# Patient Record
Sex: Male | Born: 1970 | Race: White | Hispanic: No | Marital: Married | Smoking: Current some day smoker
Health system: Southern US, Community
[De-identification: ages and names within clinical notes are randomized; demographics above are authoritative.]

## PROBLEM LIST (undated history)

## (undated) DIAGNOSIS — I10 Essential (primary) hypertension: Secondary | ICD-10-CM

## (undated) DIAGNOSIS — N200 Calculus of kidney: Secondary | ICD-10-CM

## (undated) DIAGNOSIS — K5792 Diverticulitis of intestine, part unspecified, without perforation or abscess without bleeding: Secondary | ICD-10-CM

## (undated) HISTORY — PX: FOOT SURGERY: SHX648

---

## 2000-04-19 ENCOUNTER — Emergency Department (HOSPITAL_COMMUNITY): Admission: EM | Admit: 2000-04-19 | Discharge: 2000-04-19 | Payer: Self-pay | Admitting: Emergency Medicine

## 2000-04-19 ENCOUNTER — Encounter: Payer: Self-pay | Admitting: Emergency Medicine

## 2002-10-12 ENCOUNTER — Emergency Department (HOSPITAL_COMMUNITY): Admission: EM | Admit: 2002-10-12 | Discharge: 2002-10-13 | Payer: Self-pay | Admitting: Emergency Medicine

## 2003-06-10 ENCOUNTER — Emergency Department (HOSPITAL_COMMUNITY): Admission: EM | Admit: 2003-06-10 | Discharge: 2003-06-10 | Payer: Self-pay | Admitting: Emergency Medicine

## 2003-09-17 ENCOUNTER — Emergency Department (HOSPITAL_COMMUNITY): Admission: EM | Admit: 2003-09-17 | Discharge: 2003-09-17 | Payer: Self-pay | Admitting: Family Medicine

## 2003-10-31 ENCOUNTER — Emergency Department (HOSPITAL_COMMUNITY): Admission: EM | Admit: 2003-10-31 | Discharge: 2003-11-01 | Payer: Self-pay | Admitting: Emergency Medicine

## 2004-06-02 ENCOUNTER — Emergency Department (HOSPITAL_COMMUNITY): Admission: EM | Admit: 2004-06-02 | Discharge: 2004-06-02 | Payer: Self-pay | Admitting: Emergency Medicine

## 2007-04-05 ENCOUNTER — Emergency Department (HOSPITAL_COMMUNITY): Admission: EM | Admit: 2007-04-05 | Discharge: 2007-04-05 | Payer: Self-pay | Admitting: Family Medicine

## 2011-02-05 LAB — POCT RAPID STREP A: Streptococcus, Group A Screen (Direct): NEGATIVE

## 2019-02-07 ENCOUNTER — Emergency Department (HOSPITAL_BASED_OUTPATIENT_CLINIC_OR_DEPARTMENT_OTHER): Payer: 59

## 2019-02-07 ENCOUNTER — Encounter (HOSPITAL_BASED_OUTPATIENT_CLINIC_OR_DEPARTMENT_OTHER): Payer: Self-pay | Admitting: Emergency Medicine

## 2019-02-07 ENCOUNTER — Other Ambulatory Visit: Payer: Self-pay

## 2019-02-07 ENCOUNTER — Emergency Department (HOSPITAL_BASED_OUTPATIENT_CLINIC_OR_DEPARTMENT_OTHER)
Admission: EM | Admit: 2019-02-07 | Discharge: 2019-02-07 | Disposition: A | Discharge status: Home / Self Care | Payer: 59 | Attending: Emergency Medicine | Admitting: Emergency Medicine

## 2019-02-07 DIAGNOSIS — Z79899 Other long term (current) drug therapy: Secondary | ICD-10-CM | POA: Diagnosis not present

## 2019-02-07 DIAGNOSIS — K5792 Diverticulitis of intestine, part unspecified, without perforation or abscess without bleeding: Secondary | ICD-10-CM

## 2019-02-07 DIAGNOSIS — F17220 Nicotine dependence, chewing tobacco, uncomplicated: Secondary | ICD-10-CM | POA: Insufficient documentation

## 2019-02-07 DIAGNOSIS — R1032 Left lower quadrant pain: Secondary | ICD-10-CM | POA: Diagnosis present

## 2019-02-07 LAB — CBC
HCT: 56.5 % — ABNORMAL HIGH (ref 39.0–52.0)
Hemoglobin: 18.7 g/dL — ABNORMAL HIGH (ref 13.0–17.0)
MCH: 30.7 pg (ref 26.0–34.0)
MCHC: 33.1 g/dL (ref 30.0–36.0)
MCV: 92.6 fL (ref 80.0–100.0)
Platelets: 209 10*3/uL (ref 150–400)
RBC: 6.1 MIL/uL — ABNORMAL HIGH (ref 4.22–5.81)
RDW: 13.1 % (ref 11.5–15.5)
WBC: 11.6 10*3/uL — ABNORMAL HIGH (ref 4.0–10.5)
nRBC: 0 % (ref 0.0–0.2)

## 2019-02-07 LAB — COMPREHENSIVE METABOLIC PANEL
ALT: 28 U/L (ref 0–44)
AST: 21 U/L (ref 15–41)
Albumin: 5.2 g/dL — ABNORMAL HIGH (ref 3.5–5.0)
Alkaline Phosphatase: 68 U/L (ref 38–126)
Anion gap: 13 (ref 5–15)
BUN: 15 mg/dL (ref 6–20)
CO2: 25 mmol/L (ref 22–32)
Calcium: 9.7 mg/dL (ref 8.9–10.3)
Chloride: 99 mmol/L (ref 98–111)
Creatinine, Ser: 1.26 mg/dL — ABNORMAL HIGH (ref 0.61–1.24)
GFR calc Af Amer: >60 mL/min (ref >60)
GFR calc non Af Amer: >60 mL/min (ref >60)
Glucose, Bld: 104 mg/dL — ABNORMAL HIGH (ref 70–99)
Potassium: 4 mmol/L (ref 3.5–5.1)
Sodium: 137 mmol/L (ref 135–145)
Total Bilirubin: 0.7 mg/dL (ref 0.3–1.2)
Total Protein: 8.7 g/dL — ABNORMAL HIGH (ref 6.5–8.1)

## 2019-02-07 LAB — URINALYSIS, ROUTINE W REFLEX MICROSCOPIC
Bilirubin Urine: NEGATIVE
Glucose, UA: NEGATIVE mg/dL
Hgb urine dipstick: NEGATIVE
Ketones, ur: NEGATIVE mg/dL
Leukocytes,Ua: NEGATIVE
Nitrite: NEGATIVE
Protein, ur: NEGATIVE mg/dL
Specific Gravity, Urine: 1.02 (ref 1.005–1.030)
pH: 6 (ref 5.0–8.0)

## 2019-02-07 LAB — LIPASE, BLOOD: Lipase: 33 U/L (ref 11–51)

## 2019-02-07 MED ORDER — ONDANSETRON HCL 4 MG/2ML IJ SOLN
4.0000 mg | Freq: Once | INTRAMUSCULAR | Status: AC
  Administered 2019-02-07: 4 mg via INTRAVENOUS
  Filled 2019-02-07: qty 2

## 2019-02-07 MED ORDER — CIPROFLOXACIN HCL 500 MG PO TABS
500.0000 mg | ORAL_TABLET | Freq: Once | ORAL | Status: AC
  Administered 2019-02-07: 22:00:00 500 mg via ORAL
  Filled 2019-02-07: qty 1

## 2019-02-07 MED ORDER — ONDANSETRON HCL 4 MG PO TABS: 4.0000 mg | ORAL_TABLET | Freq: Three times a day (TID) | ORAL | 0 refills | Status: DC | PRN

## 2019-02-07 MED ORDER — CIPROFLOXACIN HCL 500 MG PO TABS: 500.0000 mg | ORAL_TABLET | Freq: Two times a day (BID) | ORAL | 0 refills | Status: AC

## 2019-02-07 MED ORDER — METRONIDAZOLE 500 MG PO TABS
500.0000 mg | ORAL_TABLET | Freq: Once | ORAL | Status: AC
  Administered 2019-02-07: 22:00:00 500 mg via ORAL
  Filled 2019-02-07: qty 1

## 2019-02-07 MED ORDER — FENTANYL CITRATE (PF) 100 MCG/2ML IJ SOLN
50.0000 ug | Freq: Once | INTRAMUSCULAR | Status: AC
  Administered 2019-02-07: 50 ug via INTRAVENOUS
  Filled 2019-02-07: qty 2

## 2019-02-07 MED ORDER — SODIUM CHLORIDE 0.9 % IV BOLUS
1000.0000 mL | Freq: Once | INTRAVENOUS | Status: AC
  Administered 2019-02-07: 20:00:00 1000 mL via INTRAVENOUS

## 2019-02-07 MED ORDER — METRONIDAZOLE 500 MG PO TABS: 500.0000 mg | ORAL_TABLET | Freq: Three times a day (TID) | ORAL | 0 refills | Status: AC

## 2019-02-07 MED ORDER — IOHEXOL 300 MG/ML  SOLN
100.0000 mL | Freq: Once | INTRAMUSCULAR | Status: AC | PRN
  Administered 2019-02-07: 100 mL via INTRAVENOUS

## 2019-02-07 NOTE — ED Triage Notes (Signed)
Patient states that he is having pain to his lower abdominal area. The patient reports that he is having intermittent increasing in intensity

## 2019-02-07 NOTE — ED Provider Notes (Signed)
[facility] EMERGENCY DEPARTMENT Provider Note   CSN: [id number] Arrival date & time: 02/07/19  1538     History   Chief Complaint Chief Complaint  Patient presents with   Abdominal Pain    HPI Alan Harris is a 48 y.o. male.     The history is provided by the patient.  Abdominal Pain Pain location:  LLQ Pain quality: aching   Pain radiates to:  Does not radiate Pain severity:  Mild Onset quality:  Gradual Duration:  2 days Timing:  Intermittent Progression:  Waxing and waning Chronicity:  Recurrent Context comment:  Hx of diverticulitis  Relieved by:  Nothing Worsened by:  Nothing Associated symptoms: nausea   Associated symptoms: no chest pain, no chills, no cough, no diarrhea, no dysuria, no fever, no hematuria, no shortness of breath, no sore throat and no vomiting   Risk factors: has not had multiple surgeries     History reviewed. No pertinent past medical history.  There are no active problems to display for this patient.   History reviewed. No pertinent surgical history.      Home Medications    Prior to Admission medications   Medication Sig Start Date End Date Taking? Authorizing Provider  ciprofloxacin (CIPRO) 500 MG tablet Take 1 tablet (500 mg total) by mouth 2 (two) times daily for 10 days. 02/07/19 02/17/19  Dacota Devall, DO  ezetimibe-simvastatin (VYTORIN) 10-40 MG tablet Take 1 tablet by mouth daily. 01/26/19   [provider]  metroNIDAZOLE (FLAGYL) 500 MG tablet Take 1 tablet (500 mg total) by mouth 3 (three) times daily for 10 days. 02/07/19 02/17/19  Kimiah Hibner, DO  ondansetron (ZOFRAN) 4 MG tablet Take 1 tablet (4 mg total) by mouth every 8 (eight) hours as needed for nausea or vomiting. 02/07/19   Virgina Norfolk, DO    Family History History reviewed. No pertinent family history.  Social History Social History   Tobacco Use   Smoking status: Never Smoker   Smokeless tobacco: Current User   Types: Chew  Substance Use Topics   Alcohol use: Yes    Frequency: Never   Drug use: Never     Allergies   Patient has no known allergies.   Review of Systems Review of Systems  Constitutional: Negative for chills and fever.  HENT: Negative for ear pain and sore throat.   Eyes: Negative for pain and visual disturbance.  Respiratory: Negative for cough and shortness of breath.   Cardiovascular: Negative for chest pain and palpitations.  Gastrointestinal: Positive for abdominal pain and nausea. Negative for diarrhea and vomiting.  Genitourinary: Negative for dysuria and hematuria.  Musculoskeletal: Negative for arthralgias and back pain.  Skin: Negative for color change and rash.  Neurological: Negative for seizures and syncope.  All other systems reviewed and are negative.    Physical Exam Updated Vital Signs BP (!) 153/117    Pulse 72    Temp 99.5 F (37.5 C) (Oral)    Resp 18    Ht 5\' 8"  (1.727 m)    Wt 93 kg    SpO2 99%    BMI 31.17 kg/m   Physical Exam Vitals signs and nursing note reviewed.  Constitutional:      Appearance: He is well-developed.  HENT:     Head: Normocephalic and atraumatic.     Mouth/Throat:     Mouth: Mucous membranes are moist.  Eyes:     Extraocular Movements: Extraocular movements intact.     Conjunctiva/sclera: Conjunctivae  normal.     Pupils: Pupils are equal, round, and reactive to light.  Neck:     Musculoskeletal: Neck supple.  Cardiovascular:     Rate and Rhythm: Normal rate and regular rhythm.     Heart sounds: Normal heart sounds. No murmur.  Pulmonary:     Effort: Pulmonary effort is normal. No respiratory distress.     Breath sounds: Normal breath sounds.  Abdominal:     General: Abdomen is flat.     Palpations: Abdomen is soft.     Tenderness: There is abdominal tenderness in the suprapubic area and left lower quadrant.  Skin:    General: Skin is warm and dry.     Capillary Refill: Capillary refill takes less than 2  seconds.  Neurological:     General: No focal deficit present.     Mental Status: He is alert.  Psychiatric:        Mood and Affect: Mood normal.      ED Treatments / Results  Labs (all labs ordered are listed, but only abnormal results are displayed) Labs Reviewed  COMPREHENSIVE METABOLIC PANEL - Abnormal; Notable for the following components:      Result Value   Glucose, Bld 104 (*)    Creatinine, Ser 1.26 (*)    Total Protein 8.7 (*)    Albumin 5.2 (*)    All other components within normal limits  CBC - Abnormal; Notable for the following components:   WBC 11.6 (*)    RBC 6.10 (*)    Hemoglobin 18.7 (*)    HCT 56.5 (*)    All other components within normal limits  LIPASE, BLOOD  URINALYSIS, ROUTINE W REFLEX MICROSCOPIC    EKG None  Radiology Ct Abdomen Pelvis W Contrast  Result Date: 02/07/2019 CLINICAL DATA:  Lower abdominal pain. EXAM: CT ABDOMEN AND PELVIS WITH CONTRAST TECHNIQUE: Multidetector CT imaging of the abdomen and pelvis was performed using the standard protocol following bolus administration of intravenous contrast. CONTRAST:  143mL OMNIPAQUE IOHEXOL 300 MG/ML  SOLN COMPARISON:  CT abdomen pelvis dated January 20, 2018. FINDINGS: Lower chest: No acute abnormality. Hepatobiliary: Unchanged mild diffuse hepatic steatosis. No focal liver abnormality. The gallbladder is unremarkable. No biliary dilatation. Pancreas: Unremarkable. No pancreatic ductal dilatation or surrounding inflammatory changes. Spleen: Normal in size. Unchanged 2.5 cm cyst with mild peripheral calcification, benign. Adrenals/Urinary Tract: Adrenal glands are unremarkable. Kidneys are normal, without renal calculi, focal lesion, or hydronephrosis. Bladder is unremarkable for the degree of distention. Stomach/Bowel: Short segment wall thickening of the mid sigmoid colon adjacent to an inflamed diverticulum, with mild adjacent fat stranding, consistent with acute diverticulitis. The stomach and  small bowel are unremarkable. Normal appendix. Vascular/Lymphatic: No significant vascular findings are present. No enlarged abdominal or pelvic lymph nodes. Reproductive: Unchanged mild prostatomegaly. Other: No abdominal wall hernia or abnormality. No abdominopelvic ascites. No pneumoperitoneum. Musculoskeletal: No acute or significant osseous findings. IMPRESSION: 1. Acute sigmoid diverticulitis.  No perforation or abscess. Electronically Signed   By: Titus Dubin M.D.   On: 02/07/2019 21:26    Procedures Procedures (including critical care time)  Medications Ordered in ED Medications  sodium chloride 0.9 % bolus 1,000 mL (0 mLs Intravenous Stopped 02/07/19 2211)  fentaNYL (SUBLIMAZE) injection 50 mcg (50 mcg Intravenous Given 02/07/19 2002)  ondansetron (ZOFRAN) injection 4 mg (4 mg Intravenous Given 02/07/19 2002)  iohexol (OMNIPAQUE) 300 MG/ML solution 100 mL (100 mLs Intravenous Contrast Given 02/07/19 2104)  metroNIDAZOLE (FLAGYL) tablet 500 mg (500  mg Oral Given 02/07/19 2209)  ciprofloxacin (CIPRO) tablet 500 mg (500 mg Oral Given 02/07/19 2209)     Initial Impression / Assessment and Plan / ED Course  I have reviewed the triage vital signs and the nursing notes.  Pertinent labs & imaging results that were available during my care of the patient were reviewed by me and considered in my medical decision making (see chart for details).        Tommas OlpJoseph Sabas is a 48 year old male who presents to the ED with left lower quadrant abdominal pain.  Patient with normal vitals.  No fever.  History of diverticulitis.  Patient with lower abdominal exam for last 2 days.  No diarrhea.  Has had some nausea.  No fever.  Tender in the suprapubic and left lower quadrant on exam.  No urinary symptoms.  Concern for UTI, diverticulitis, bowel obstruction.   Lab work showed mild leukocytosis.  But otherwise electrolytes unremarkable.  Urinalysis negative for infection.  CT scan of the abdomen and  pelvis did show acute diverticulitis but no abscess or perforation.  No bowel obstruction.  No other intra-abdominal process.   Overall patient with diverticulitis and will treat with antibiotics.  Felt better after fluid bolus, Zofran, fentanyl.  Will prescribe antibiotics and Zofran outpatient.  Given return precautions and discharged from ED in good condition.  This chart was dictated using voice recognition software.  Despite best efforts to proofread,  errors can occur which can change the documentation meaning.    Final Clinical Impressions(s) / ED Diagnoses   Final diagnoses:  Acute diverticulitis    ED Discharge Orders         Ordered    ondansetron (ZOFRAN) 4 MG tablet  Every 8 hours PRN     02/07/19 2204    metroNIDAZOLE (FLAGYL) 500 MG tablet  3 times daily     02/07/19 2204    ciprofloxacin (CIPRO) 500 MG tablet  2 times daily     02/07/19 2204           Virgina NorfolkCuratolo, Akshath Mccarey, DO 02/07/19 2214

## 2019-02-07 NOTE — ED Notes (Signed)
Pt reports hx of diverticulitis

## 2019-02-07 NOTE — ED Notes (Signed)
Pt requesting ibuprofen for HA, reports hx of migraine

## 2019-02-07 NOTE — ED Notes (Signed)
Patient transported to CT 

## 2020-06-19 ENCOUNTER — Encounter (HOSPITAL_COMMUNITY): Payer: Self-pay

## 2020-06-19 ENCOUNTER — Other Ambulatory Visit: Payer: Self-pay

## 2020-06-19 ENCOUNTER — Emergency Department (HOSPITAL_COMMUNITY): Payer: No Typology Code available for payment source

## 2020-06-19 ENCOUNTER — Emergency Department (HOSPITAL_COMMUNITY)
Admission: EM | Admit: 2020-06-19 | Discharge: 2020-06-19 | Disposition: A | Discharge status: Home / Self Care | Payer: No Typology Code available for payment source | Attending: Emergency Medicine | Admitting: Emergency Medicine

## 2020-06-19 DIAGNOSIS — M20012 Mallet finger of left finger(s): Secondary | ICD-10-CM | POA: Diagnosis not present

## 2020-06-19 DIAGNOSIS — X58XXXA Exposure to other specified factors, initial encounter: Secondary | ICD-10-CM | POA: Diagnosis not present

## 2020-06-19 DIAGNOSIS — S6992XA Unspecified injury of left wrist, hand and finger(s), initial encounter: Secondary | ICD-10-CM | POA: Diagnosis present

## 2020-06-19 DIAGNOSIS — Y990 Civilian activity done for income or pay: Secondary | ICD-10-CM | POA: Diagnosis not present

## 2020-06-19 MED ORDER — IBUPROFEN 600 MG PO TABS: 600.0000 mg | ORAL_TABLET | Freq: Four times a day (QID) | ORAL | 0 refills | Status: AC | PRN

## 2020-06-19 NOTE — ED Triage Notes (Addendum)
Pt presents with c/o left ring finger injury. 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

## 2020-06-19 NOTE — ED Provider Notes (Signed)
[facility]-EMERGENCY DEPT Provider Note   CSN: [id number] Arrival date & time: 06/19/20  1287     History Chief Complaint  Patient presents with  . Finger Injury    Alan Harris is a 50 y.o. male.  The history is provided by the patient. No language interpreter was used.     50 year old male presenting for evaluation of injury to his left ring finger on his nondominant hand.  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  States pain is minimal but he is unable to fully extend the tip of his finger.  He does not complain of any numbness.  He denies any other injury.  He rates pain as mild throbbing 1 out of 10.  No specific treatment tried.  History reviewed. No pertinent past medical history.  There are no problems to display for this patient.   History reviewed. No pertinent surgical history.     History reviewed. No pertinent family history.  Social History   Tobacco Use  . Smoking status: Never Smoker  . Smokeless tobacco: Current User    Types: Chew  Substance Use Topics  . Alcohol use: Yes  . Drug use: Never    Home Medications Prior to Admission medications   Medication Sig Start Date End Date Taking? Authorizing Provider  ezetimibe-simvastatin (VYTORIN) 10-40 MG tablet Take 1 tablet by mouth daily. 01/26/19   [provider]  ondansetron (ZOFRAN) 4 MG tablet Take 1 tablet (4 mg total) by mouth every 8 (eight) hours as needed for nausea or vomiting. 02/07/19   Virgina Norfolk, DO    Allergies    Patient has no known allergies.  Review of Systems   Review of Systems  Constitutional: Negative for fever.  Musculoskeletal: Positive for arthralgias.  Skin: Negative for wound.  Neurological: Negative for numbness.    Physical Exam Updated Vital Signs BP (!) 154/110 (BP Location: Left Arm)   Pulse 99   Temp 97.6  F (36.4 C) (Oral)   Resp 16   SpO2 96%   Physical Exam Vitals and nursing note reviewed.  Constitutional:      General: He is not in acute distress.    Appearance: He is well-developed and well-nourished.  HENT:     Head: Atraumatic.  Eyes:     Conjunctiva/sclera: Conjunctivae normal.  Musculoskeletal:        General: Deformity (Left ring finger: Mallet finger appearance with flexion at the DIP joint unable to fully extend finger.  Mildly tender to palpation without obvious deformity.  Brisk cap refill sensation intact distally) present.     Cervical back: Neck supple.  Skin:    Findings: No rash.  Neurological:     Mental Status: He is alert.  Psychiatric:        Mood and Affect: Mood and affect normal.     ED Results / Procedures / Treatments   Labs (all labs ordered are listed, but only abnormal results are displayed) Labs Reviewed - No data to display  EKG None  Radiology DG Finger Ring Left  Result Date: 06/19/2020 CLINICAL DATA:  Finger injury with pain. EXAM: LEFT RING FINGER 2+V COMPARISON:  None. FINDINGS: Three views study shows no evidence for an acute fracture. No evidence for subluxation or dislocation. Study somewhat limited in that a true lateral projection is not been obtained. IMPRESSION: No evidence  for acute bony abnormality. Electronically Signed   By: Kennith Center M.D.   On: 06/19/2020 09:59    Procedures Procedures   Medications Ordered in ED Medications - No data to display  ED Course  I have reviewed the triage vital signs and the nursing notes.  Pertinent labs & imaging results that were available during my care of the patient were reviewed by me and considered in my medical decision making (see chart for details).    MDM Rules/Calculators/A&P                          BP (!) 154/110 (BP Location: Left Arm)   Pulse 99   Temp 97.6 F (36.4 C) (Oral)   Resp 16   SpO2 96%   Final Clinical Impression(s) / ED Diagnoses Final  diagnoses:  Mallet deformity of left ring finger    Rx / DC Orders ED Discharge Orders         Ordered    ibuprofen (ADVIL) 600 MG tablet  Every 6 hours PRN        06/19/20 1005         9:38 AM Patient presents with mallet finger deformity of his left ring finger likely suggestive of an extensor tendon injury.  Will place finger in a finger splint, give referral to hand specialist.   Fayrene Helper, PA-C 06/19/20 1006    Tegeler, Canary Brim, MD 06/19/20 [phone]

## 2020-06-19 NOTE — Discharge Instructions (Signed)
You are likely having an injury to the extensor tendon of your finger.  Please wear finger splint and follow up with hand specialist next week for further care.  You may take tylenol or ibuprofen as needed for pain.

## 2020-12-30 IMAGING — CT CT ABD-PELV W/ CM
2 of 5 series · 16 of 46 positions shown, 18 images · IV contrast (APPLIED)
Comparison: CT abdomen pelvis dated January 20, 2018.

CLINICAL DATA: Lower abdominal pain.

EXAM:
CT ABDOMEN AND PELVIS WITH CONTRAST
TECHNIQUE: Multidetector CT imaging of the abdomen and pelvis was performed
using the standard protocol following bolus administration of
intravenous contrast.
CONTRAST:  100mL OMNIPAQUE IOHEXOL 300 MG/ML  SOLN

[Series 2: axial st · axial · 0.77mm/px · z∈[-558,-108]mm · 13 of 102 slices shown, 15 images]
[im 6/102  soft-tissue]
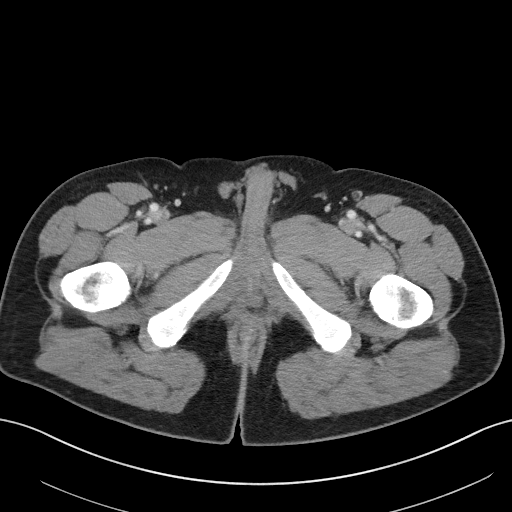
[im 6/102  bone]
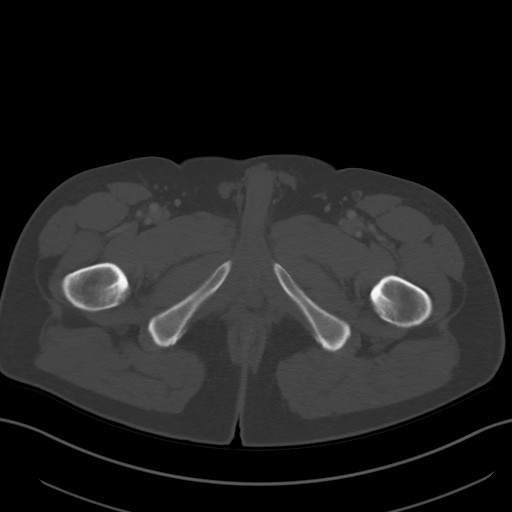
[im 16/102  soft-tissue]
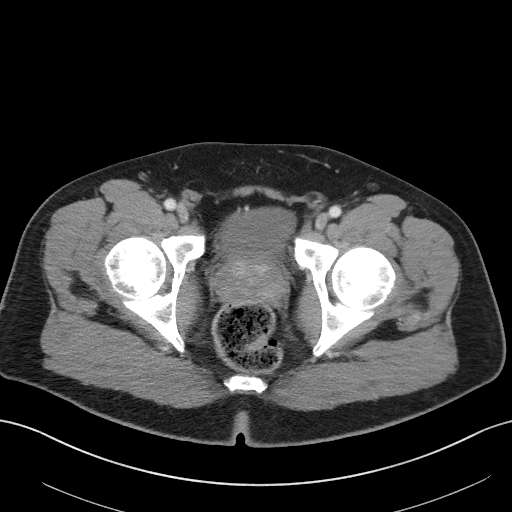
[im 22/102  soft-tissue]
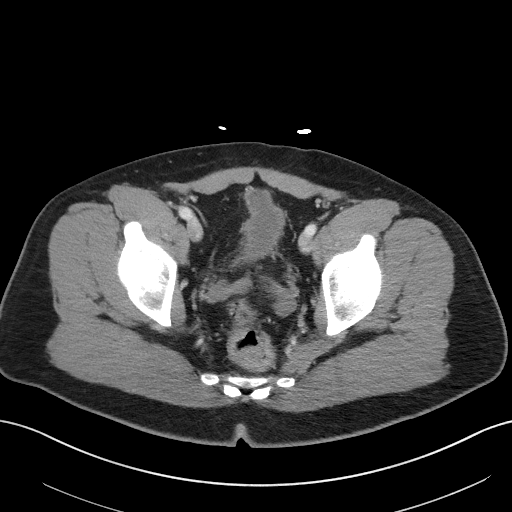
[im 27/102  soft-tissue]
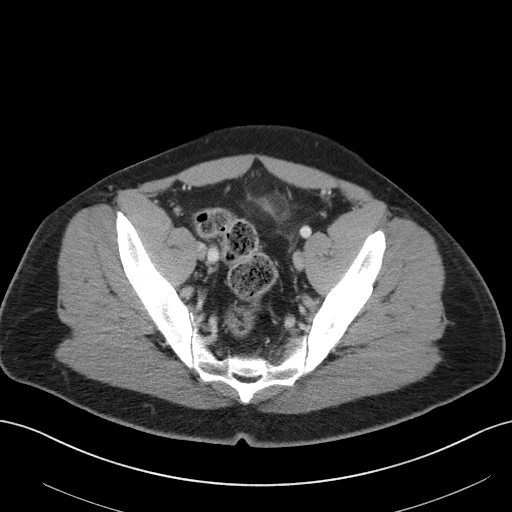
[im 38/102  soft-tissue]
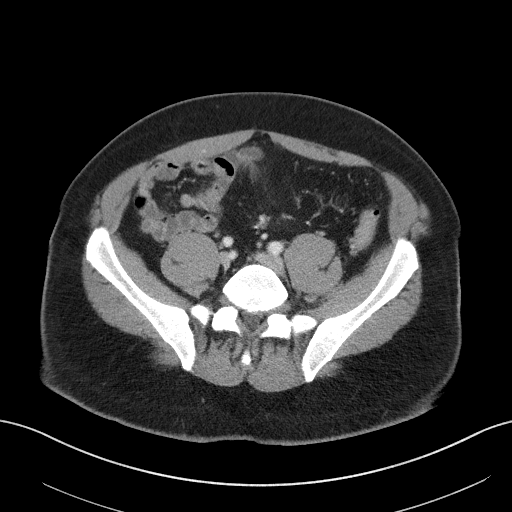
[im 43/102  soft-tissue]
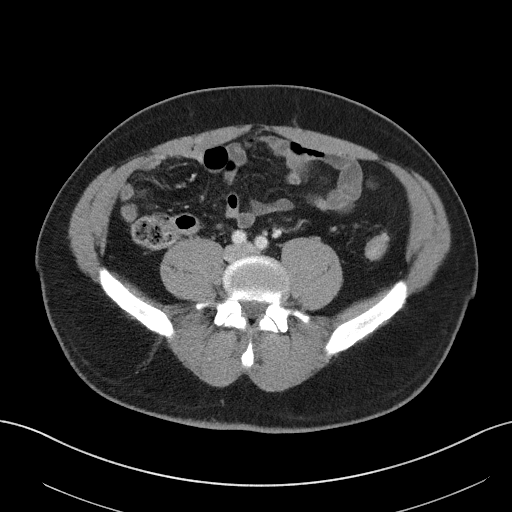
[im 54/102  soft-tissue]
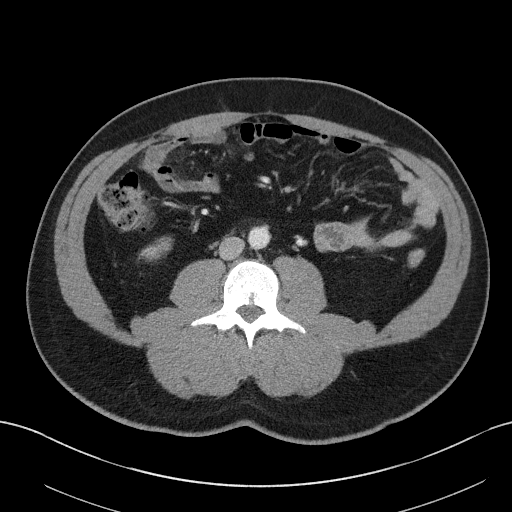
[im 59/102  soft-tissue]
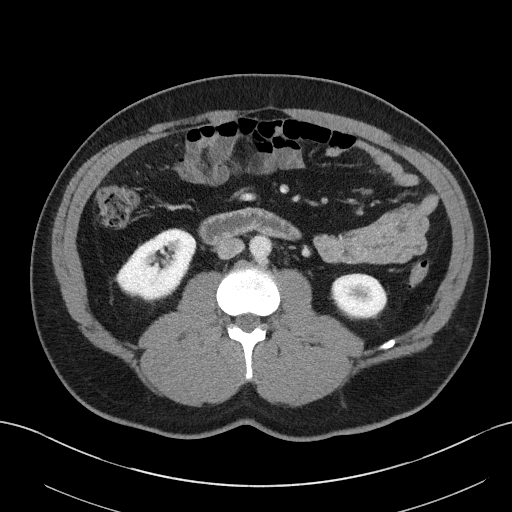
[im 64/102  soft-tissue]
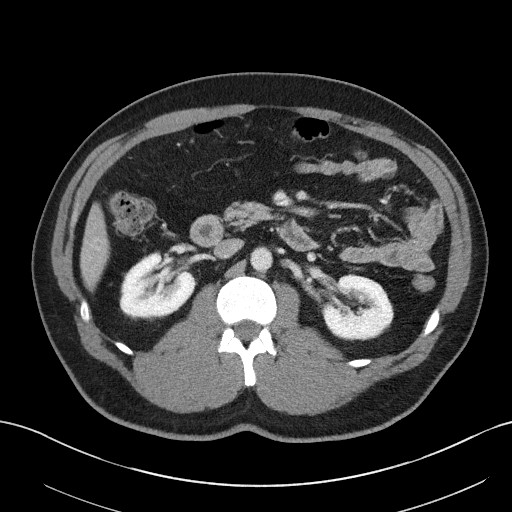
[im 64/102  bone]
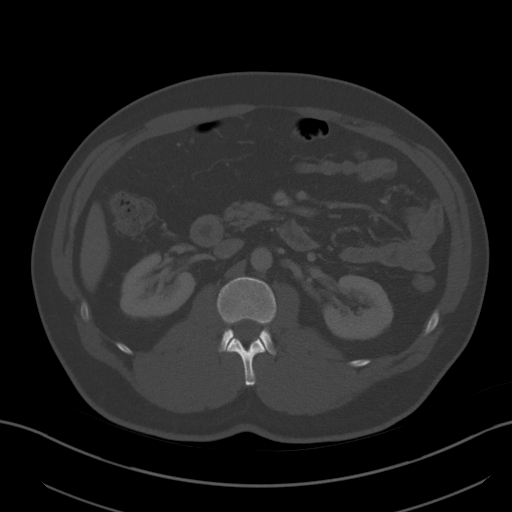
[im 75/102  soft-tissue]
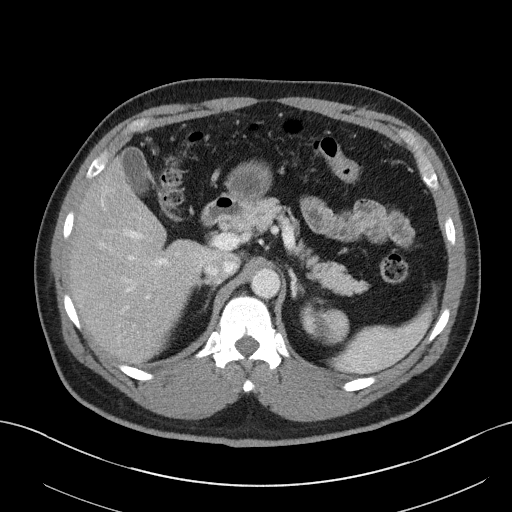
[im 80/102  soft-tissue]
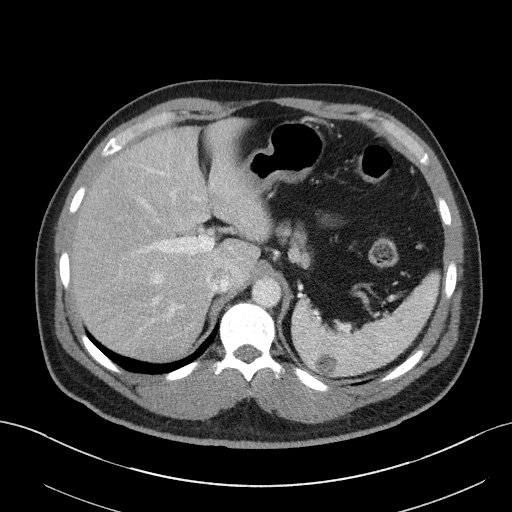
[im 86/102  soft-tissue]
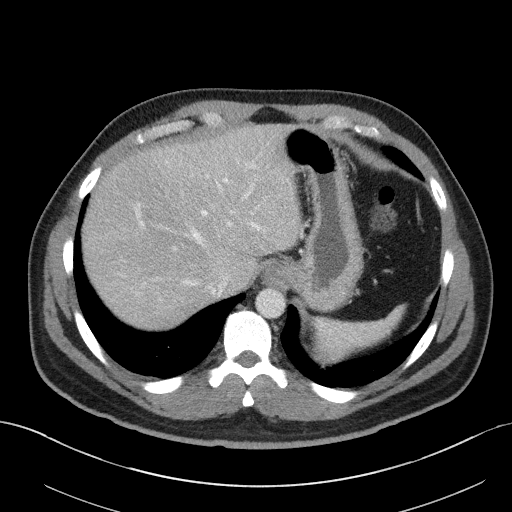
[im 96/102  soft-tissue]
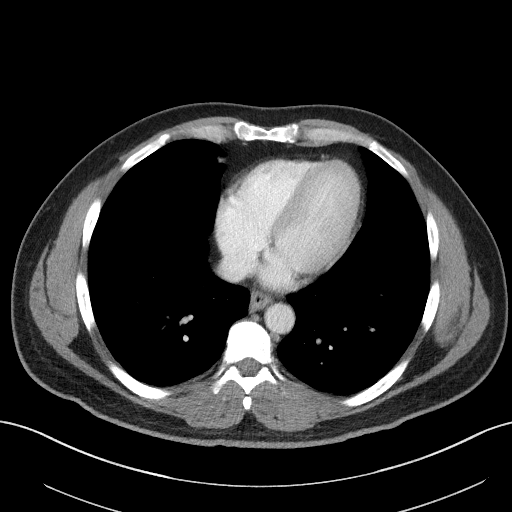

[Series 5: coronal st · coronal · 0.76mm/px · 3 of 94 slices shown]
[im 32/94  soft-tissue]
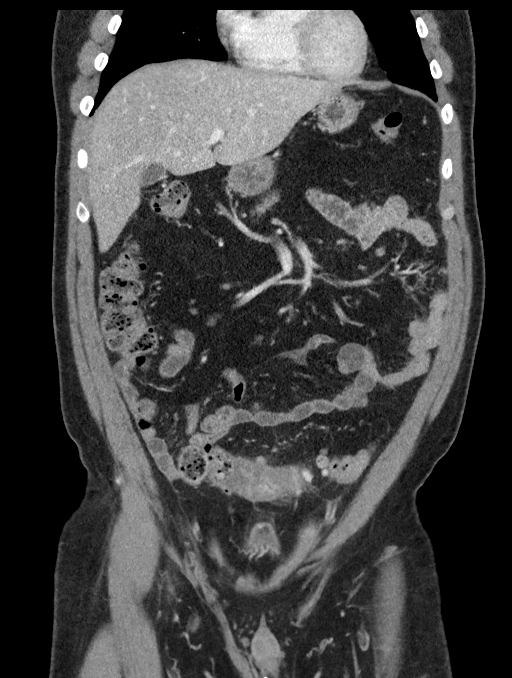
[im 42/94  soft-tissue]
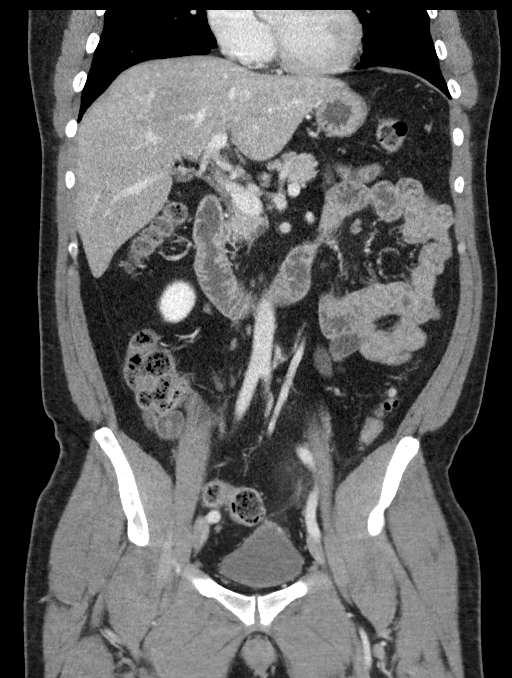
[im 52/94  soft-tissue]
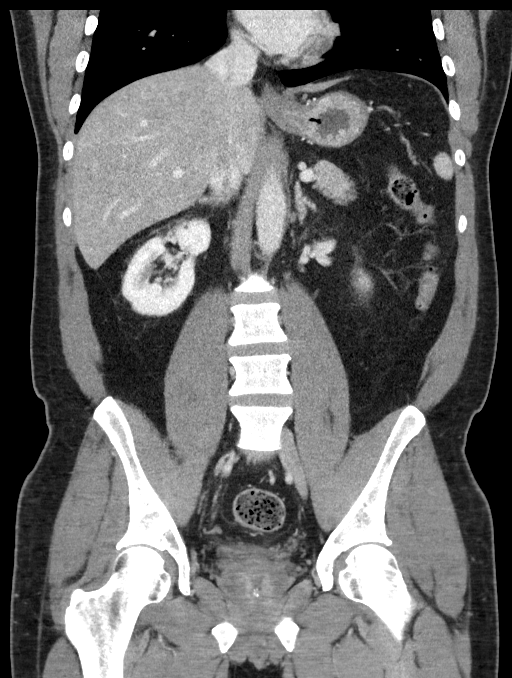

[16 of 46 positions shown; findings below may reference images not displayed]

FINDINGS: Lower chest: No acute abnormality.

Hepatobiliary: Unchanged mild diffuse hepatic steatosis. No focal
liver abnormality. The gallbladder is unremarkable. No biliary
dilatation.

Pancreas: Unremarkable. No pancreatic ductal dilatation or
surrounding inflammatory changes.

Spleen: Normal in size. Unchanged 2.5 cm cyst with mild peripheral
calcification, benign.

Adrenals/Urinary Tract: Adrenal glands are unremarkable. Kidneys are
normal, without renal calculi, focal lesion, or hydronephrosis.
Bladder is unremarkable for the degree of distention.

Stomach/Bowel: Short segment wall thickening of the mid sigmoid
colon adjacent to an inflamed diverticulum, with mild adjacent fat
stranding, consistent with acute diverticulitis. The stomach and
small bowel are unremarkable. Normal appendix.

Vascular/Lymphatic: No significant vascular findings are present. No
enlarged abdominal or pelvic lymph nodes.

Reproductive: Unchanged mild prostatomegaly.

Other: No abdominal wall hernia or abnormality. No abdominopelvic
ascites. No pneumoperitoneum.

Musculoskeletal: No acute or significant osseous findings.
IMPRESSION: 1. Acute sigmoid diverticulitis.  No perforation or abscess.

## 2021-04-19 ENCOUNTER — Emergency Department (HOSPITAL_BASED_OUTPATIENT_CLINIC_OR_DEPARTMENT_OTHER)
Admission: EM | Admit: 2021-04-19 | Discharge: 2021-04-19 | Disposition: A | Discharge status: Home / Self Care | Payer: 59 | Attending: Emergency Medicine | Admitting: Emergency Medicine

## 2021-04-19 ENCOUNTER — Other Ambulatory Visit: Payer: Self-pay

## 2021-04-19 ENCOUNTER — Encounter (HOSPITAL_BASED_OUTPATIENT_CLINIC_OR_DEPARTMENT_OTHER): Payer: Self-pay | Admitting: Emergency Medicine

## 2021-04-19 DIAGNOSIS — J019 Acute sinusitis, unspecified: Secondary | ICD-10-CM | POA: Insufficient documentation

## 2021-04-19 DIAGNOSIS — F17290 Nicotine dependence, other tobacco product, uncomplicated: Secondary | ICD-10-CM | POA: Diagnosis not present

## 2021-04-19 DIAGNOSIS — R0981 Nasal congestion: Secondary | ICD-10-CM | POA: Diagnosis present

## 2021-04-19 DIAGNOSIS — Z20822 Contact with and (suspected) exposure to covid-19: Secondary | ICD-10-CM | POA: Diagnosis not present

## 2021-04-19 LAB — RESP PANEL BY RT-PCR (FLU A&B, COVID) ARPGX2
Influenza A by PCR: NEGATIVE
Influenza B by PCR: NEGATIVE
SARS Coronavirus 2 by RT PCR: NEGATIVE

## 2021-04-19 MED ORDER — NAPROXEN 500 MG PO TABS: 500.0000 mg | ORAL_TABLET | Freq: Two times a day (BID) | ORAL | 0 refills | Status: AC

## 2021-04-19 MED ORDER — DOXYCYCLINE HYCLATE 100 MG PO CAPS: 100.0000 mg | ORAL_CAPSULE | Freq: Two times a day (BID) | ORAL | 0 refills | Status: AC

## 2021-04-19 NOTE — Discharge Instructions (Addendum)
Begin taking doxycycline and naproxen as prescribed.  Drink plenty of fluids and get plenty of rest.  If your COVID test returns positive, be sure to isolate at home for the next 5 days.  Return to the emergency department if symptoms worsen or change.

## 2021-04-19 NOTE — ED Provider Notes (Signed)
[facility] EMERGENCY DEPARTMENT Provider Note   CSN: [id number] Arrival date & time: 04/19/21  0457     History Chief Complaint  Patient presents with   Nasal Congestion    Alan Harris is a 50 y.o. male.  Patient is a 49 year old male with no significant past medical history.  He presents with a nearly 2-week history of facial pressure, nasal congestion, and sinus pain.  He describes low-grade fevers at home.  He denies any ill contacts.  He denies any chest pain or difficulty breathing.  He denies any aggravating or alleviating factors.  The history is provided by the patient.      History reviewed. No pertinent past medical history.  There are no problems to display for this patient.   Past Surgical History:  Procedure Laterality Date   FOOT SURGERY Right        Family History  Problem Relation Age of Onset   Hypertension Mother    Stroke Mother     Social History   Tobacco Use   Smoking status: Some Days    Types: Cigars   Smokeless tobacco: Current    Types: Chew  Substance Use Topics   Alcohol use: Yes   Drug use: Never    Home Medications Prior to Admission medications   Medication Sig Start Date End Date Taking? Authorizing Provider  doxycycline (VIBRAMYCIN) 100 MG capsule Take 1 capsule (100 mg total) by mouth 2 (two) times daily. One po bid x 7 days 04/19/21  Yes Saskia Simerson, Riley Lam, MD  naproxen (NAPROSYN) 500 MG tablet Take 1 tablet (500 mg total) by mouth 2 (two) times daily with a meal. 04/19/21  Yes Kaizlee Carlino, Riley Lam, MD  ezetimibe-simvastatin (VYTORIN) 10-40 MG tablet Take 1 tablet by mouth daily. 01/26/19   [provider]  ibuprofen (ADVIL) 600 MG tablet Take 1 tablet (600 mg total) by mouth every 6 (six) hours as needed. 06/19/20   Fayrene Helper, PA-C  ondansetron (ZOFRAN) 4 MG tablet Take 1 tablet (4 mg total) by mouth every 8 (eight) hours as needed for nausea or vomiting. 02/07/19   Virgina Norfolk, DO    Allergies     Patient has no known allergies.  Review of Systems   Review of Systems  All other systems reviewed and are negative.  Physical Exam Updated Vital Signs BP (!) 150/100 (BP Location: Left Arm)    Pulse 92    Temp 98 F (36.7 C) (Oral)    Resp 14    Ht 5\' 8"  (1.727 m)    Wt 94.8 kg    SpO2 96%    BMI 31.78 kg/m   Physical Exam Vitals and nursing note reviewed.  Constitutional:      General: He is not in acute distress.    Appearance: He is well-developed. He is not diaphoretic.  HENT:     Head: Normocephalic and atraumatic.     Comments: There is frontal and maxillary sinus tenderness.    Mouth/Throat:     Mouth: Mucous membranes are moist.     Pharynx: No oropharyngeal exudate or posterior oropharyngeal erythema.  Cardiovascular:     Rate and Rhythm: Normal rate and regular rhythm.     Heart sounds: No murmur heard.   No friction rub.  Pulmonary:     Effort: Pulmonary effort is normal. No respiratory distress.     Breath sounds: Normal breath sounds. No wheezing or rales.  Abdominal:     General: Bowel sounds are normal. There  is no distension.     Palpations: Abdomen is soft.     Tenderness: There is no abdominal tenderness.  Musculoskeletal:        General: Normal range of motion.     Cervical back: Normal range of motion and neck supple.  Skin:    General: Skin is warm and dry.  Neurological:     Mental Status: He is alert and oriented to person, place, and time.     Coordination: Coordination normal.    ED Results / Procedures / Treatments   Labs (all labs ordered are listed, but only abnormal results are displayed) Labs Reviewed  RESP PANEL BY RT-PCR (FLU A&B, COVID) [id number]    EKG None  Radiology No results found.  Procedures Procedures   Medications Ordered in ED Medications - No data to display  ED Course  I have reviewed the triage vital signs and the nursing notes.  Pertinent labs & imaging results that were available during my care of the  patient were reviewed by me and considered in my medical decision making (see chart for details).    MDM Rules/Calculators/A&P                             Final Clinical Impression(s) / ED Diagnoses Final diagnoses:  Acute sinusitis, recurrence not specified, unspecified location    Rx / DC Orders ED Discharge Orders          Ordered    doxycycline (VIBRAMYCIN) 100 MG capsule  2 times daily        04/19/21 0527    naproxen (NAPROSYN) 500 MG tablet  2 times daily with meals        04/19/21 0527             Geoffery Lyons, MD 04/19/21 [phone]

## 2021-04-19 NOTE — ED Triage Notes (Signed)
Pt states he thinks he has a sinus infection  Pt states he has pressure in his head and face  pt states he has congestion in his chest causing him to cough  Pt states he thinks he pulled a muscle in his back from coughing  Pt had a sinus infection 2 weeks ago  Pt is currently using sudafed and mucinex along with nyquil without relief

## 2022-05-12 IMAGING — DX DG FINGER RING 2+V*L*
3 series · 3 of 3 positions shown · non-contrast
Comparison: None.

CLINICAL DATA: Finger injury with pain.

EXAM:
LEFT RING FINGER 2+V

[finger ap]
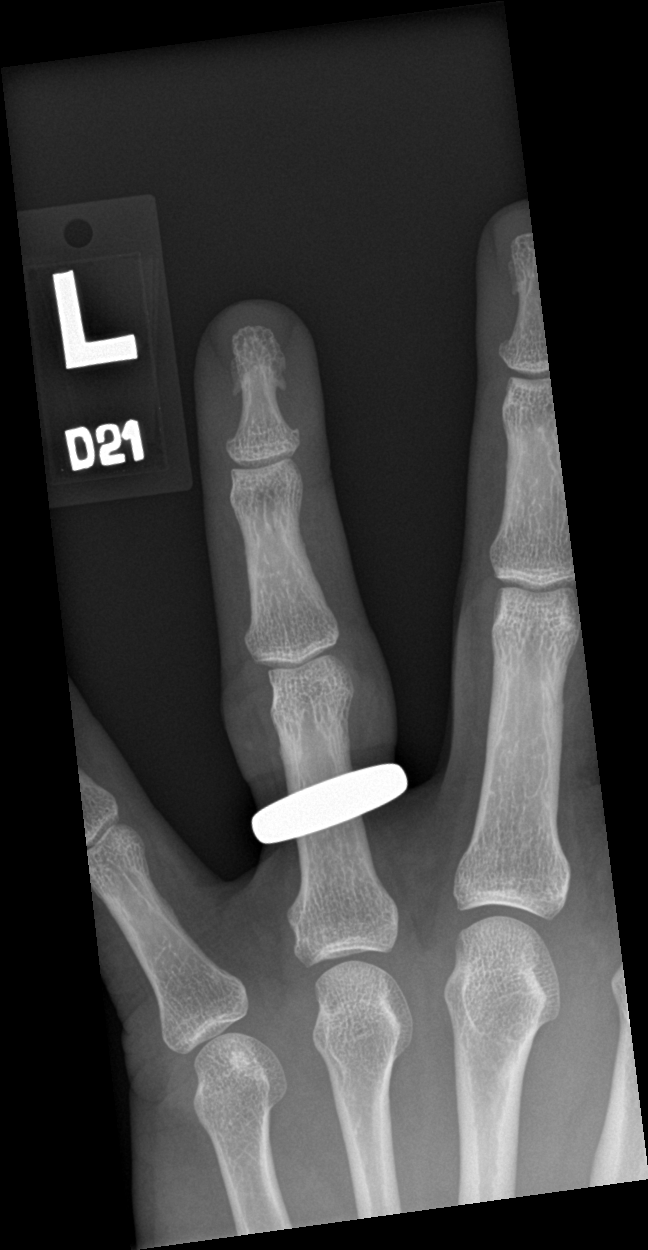

[finger obl]
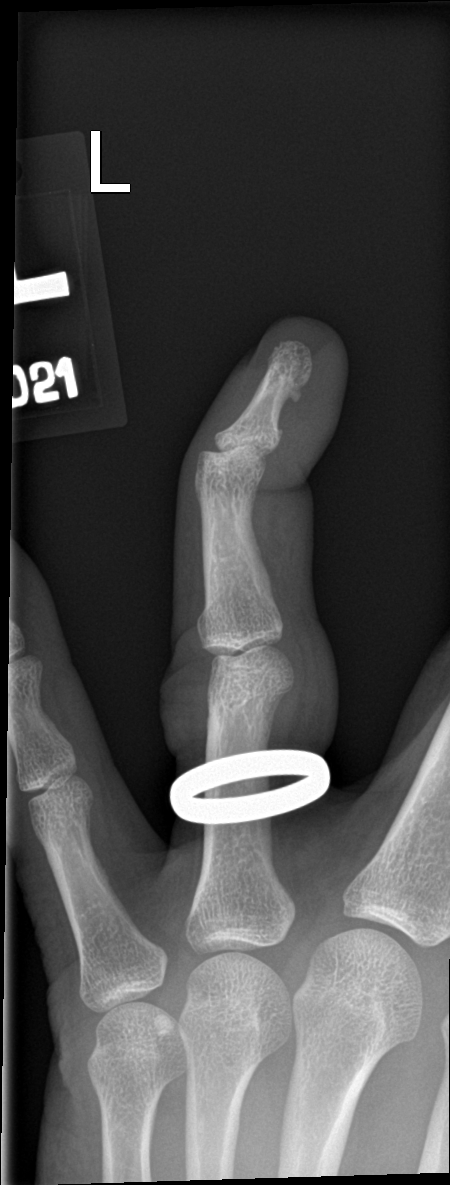

[finger lat]
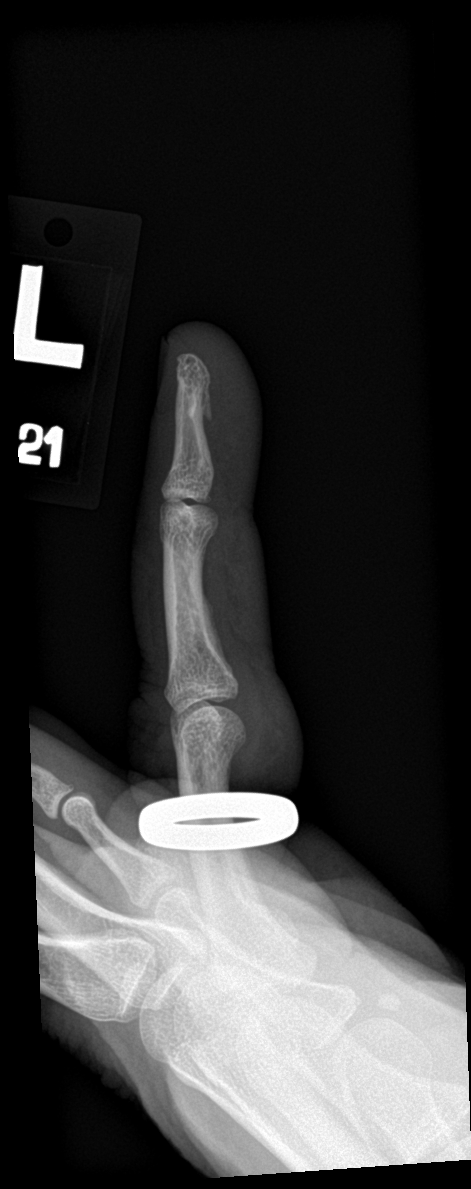

[3 of 3 positions shown; findings below may reference images not displayed]

FINDINGS: Three views study shows no evidence for an acute fracture. No
evidence for subluxation or dislocation. Study somewhat limited in
that a true lateral projection is not been obtained.
IMPRESSION: No evidence for acute bony abnormality.

## 2022-11-30 ENCOUNTER — Other Ambulatory Visit: Payer: Self-pay

## 2022-11-30 ENCOUNTER — Emergency Department (HOSPITAL_BASED_OUTPATIENT_CLINIC_OR_DEPARTMENT_OTHER)
Admission: EM | Admit: 2022-11-30 | Discharge: 2022-11-30 | Disposition: A | Discharge status: Home / Self Care | Payer: 59 | Source: Home / Self Care | Attending: Emergency Medicine | Admitting: Emergency Medicine

## 2022-11-30 ENCOUNTER — Encounter (HOSPITAL_BASED_OUTPATIENT_CLINIC_OR_DEPARTMENT_OTHER): Payer: Self-pay

## 2022-11-30 ENCOUNTER — Emergency Department (HOSPITAL_BASED_OUTPATIENT_CLINIC_OR_DEPARTMENT_OTHER): Payer: 59

## 2022-11-30 DIAGNOSIS — K5792 Diverticulitis of intestine, part unspecified, without perforation or abscess without bleeding: Secondary | ICD-10-CM

## 2022-11-30 DIAGNOSIS — R1032 Left lower quadrant pain: Secondary | ICD-10-CM | POA: Diagnosis present

## 2022-11-30 DIAGNOSIS — K5732 Diverticulitis of large intestine without perforation or abscess without bleeding: Secondary | ICD-10-CM | POA: Insufficient documentation

## 2022-11-30 HISTORY — DX: Calculus of kidney: N20.0

## 2022-11-30 HISTORY — DX: Diverticulitis of intestine, part unspecified, without perforation or abscess without bleeding: K57.92

## 2022-11-30 HISTORY — DX: Essential (primary) hypertension: I10

## 2022-11-30 LAB — CBC
HCT: 48.9 % (ref 39.0–52.0)
Hemoglobin: 16.7 g/dL (ref 13.0–17.0)
MCH: 31.4 pg (ref 26.0–34.0)
MCHC: 34.2 g/dL (ref 30.0–36.0)
MCV: 91.9 fL (ref 80.0–100.0)
Platelets: 209 10*3/uL (ref 150–400)
RBC: 5.32 MIL/uL (ref 4.22–5.81)
RDW: 13.2 % (ref 11.5–15.5)
WBC: 8.7 10*3/uL (ref 4.0–10.5)
nRBC: 0 % (ref 0.0–0.2)

## 2022-11-30 LAB — LIPASE, BLOOD: Lipase: 34 U/L (ref 11–51)

## 2022-11-30 LAB — COMPREHENSIVE METABOLIC PANEL
ALT: 30 U/L (ref 0–44)
AST: 25 U/L (ref 15–41)
Albumin: 4.4 g/dL (ref 3.5–5.0)
Alkaline Phosphatase: 64 U/L (ref 38–126)
Anion gap: 9 (ref 5–15)
BUN: 18 mg/dL (ref 6–20)
CO2: 24 mmol/L (ref 22–32)
Calcium: 8.9 mg/dL (ref 8.9–10.3)
Chloride: 104 mmol/L (ref 98–111)
Creatinine, Ser: 1.43 mg/dL — ABNORMAL HIGH (ref 0.61–1.24)
GFR, Estimated: 59 mL/min — ABNORMAL LOW (ref >60)
Glucose, Bld: 139 mg/dL — ABNORMAL HIGH (ref 70–99)
Potassium: 3.9 mmol/L (ref 3.5–5.1)
Sodium: 137 mmol/L (ref 135–145)
Total Bilirubin: 0.9 mg/dL (ref 0.3–1.2)
Total Protein: 7.5 g/dL (ref 6.5–8.1)

## 2022-11-30 MED ORDER — ONDANSETRON HCL 4 MG PO TABS: 4.0000 mg | ORAL_TABLET | Freq: Four times a day (QID) | ORAL | 0 refills | Status: AC

## 2022-11-30 MED ORDER — SODIUM CHLORIDE 0.9 % IV BOLUS
1000.0000 mL | Freq: Once | INTRAVENOUS | Status: AC
  Administered 2022-11-30: 1000 mL via INTRAVENOUS

## 2022-11-30 MED ORDER — IOHEXOL 300 MG/ML  SOLN
100.0000 mL | Freq: Once | INTRAMUSCULAR | Status: AC | PRN
  Administered 2022-11-30: 100 mL via INTRAVENOUS

## 2022-11-30 MED ORDER — TRAMADOL HCL 50 MG PO TABS: 50.0000 mg | ORAL_TABLET | Freq: Four times a day (QID) | ORAL | 0 refills | Status: AC | PRN

## 2022-11-30 MED ORDER — MORPHINE SULFATE (PF) 4 MG/ML IV SOLN
4.0000 mg | Freq: Once | INTRAVENOUS | Status: AC
  Administered 2022-11-30: 4 mg via INTRAVENOUS
  Filled 2022-11-30: qty 1

## 2022-11-30 MED ORDER — AMOXICILLIN-POT CLAVULANATE 875-125 MG PO TABS: 1.0000 | ORAL_TABLET | Freq: Two times a day (BID) | ORAL | 0 refills | Status: AC

## 2022-11-30 NOTE — ED Provider Notes (Signed)
[place] [facility] Provider Note   CSN: [id number] Arrival date & time: 11/30/22  7829     History  Chief Complaint  Patient presents with   Abdominal Pain    Alan Harris is a 52 y.o. male.  HPI   52 year old male with past medical history of diverticulitis presents emergency department left lower quadrant abdominal pain.  Patient states over the last day he has had a soreness there but the pain became more acute overnight and today.  Feels like his previous episodes of diverticulitis.  He has done well with antibiotics in the past and has not had any complications secondary to diverticulitis.  He had 1 bowel movement this morning that seemed looser than normal.  Denies any fever but endorses chills.  Mild decrease in appetite.  No genitourinary symptoms.  Home Medications Prior to Admission medications   Medication Sig Start Date End Date Taking? Authorizing Provider  doxycycline (VIBRAMYCIN) 100 MG capsule Take 1 capsule (100 mg total) by mouth 2 (two) times daily. One po bid x 7 days 04/19/21   Geoffery Lyons, MD  ezetimibe-simvastatin (VYTORIN) 10-40 MG tablet Take 1 tablet by mouth daily. 01/26/19   [provider]  ibuprofen (ADVIL) 600 MG tablet Take 1 tablet (600 mg total) by mouth every 6 (six) hours as needed. 06/19/20   Fayrene Helper, PA-C  naproxen (NAPROSYN) 500 MG tablet Take 1 tablet (500 mg total) by mouth 2 (two) times daily with a meal. 04/19/21   Geoffery Lyons, MD  ondansetron (ZOFRAN) 4 MG tablet Take 1 tablet (4 mg total) by mouth every 8 (eight) hours as needed for nausea or vomiting. 02/07/19   Virgina Norfolk, DO      Allergies    Patient has no known allergies.    Review of Systems   Review of Systems  Constitutional:  Positive for appetite change and fatigue. Negative for fever.  Respiratory:  Negative for shortness of breath.   Cardiovascular:  Negative for chest pain.  Gastrointestinal:  Positive for  abdominal pain. Negative for blood in stool, diarrhea, nausea and vomiting.  Skin:  Negative for rash.  Neurological:  Negative for headaches.    Physical Exam Updated Vital Signs BP (!) 152/110 (BP Location: Right Arm)   Pulse 89   Temp 98 F (36.7 C) (Oral)   Resp 18   Ht 5\' 8"  (1.727 m)   Wt 90.7 kg   SpO2 97%   BMI 30.41 kg/m  Physical Exam Vitals and nursing note reviewed.  Constitutional:      General: He is not in acute distress.    Appearance: Normal appearance.  HENT:     Head: Normocephalic.     Mouth/Throat:     Mouth: Mucous membranes are moist.  Cardiovascular:     Rate and Rhythm: Normal rate.  Pulmonary:     Effort: Pulmonary effort is normal. No respiratory distress.  Abdominal:     General: Bowel sounds are normal.     Palpations: Abdomen is soft.     Tenderness: There is abdominal tenderness in the left lower quadrant. There is guarding. There is no rebound.  Skin:    General: Skin is warm.  Neurological:     Mental Status: He is alert and oriented to person, place, and time. Mental status is at baseline.  Psychiatric:        Mood and Affect: Mood normal.     ED Results / Procedures / Treatments  Labs (all labs ordered are listed, but only abnormal results are displayed) Labs Reviewed  COMPREHENSIVE METABOLIC PANEL - Abnormal; Notable for the following components:      Result Value   Glucose, Bld 139 (*)    Creatinine, Ser 1.43 (*)    GFR, Estimated 59 (*)    All other components within normal limits  LIPASE, BLOOD  CBC  URINALYSIS, ROUTINE W REFLEX MICROSCOPIC    EKG None  Radiology CT ABDOMEN PELVIS W CONTRAST  Result Date: 11/30/2022 CLINICAL DATA:  Abdominal pain, acute, nonlocalized LLQ, h/o diverticulitis EXAM: CT ABDOMEN AND PELVIS WITH CONTRAST TECHNIQUE: Multidetector CT imaging of the abdomen and pelvis was performed using the standard protocol following bolus administration of intravenous contrast. RADIATION DOSE REDUCTION:  This exam was performed according to the departmental dose-optimization program which includes automated exposure control, adjustment of the mA and/or kV according to patient size and/or use of iterative reconstruction technique. CONTRAST:  OMNIPAQUE IOHEXOL 300 MG/ML  SOLN COMPARISON:  CT scan abdomen and pelvis from 02/07/2019. FINDINGS: Lower chest: The lung bases are clear. No pleural effusion. The heart is normal in size. No pericardial effusion. Hepatobiliary: The liver is normal in size. Non-cirrhotic configuration. No suspicious mass. These is mild diffuse hepatic steatosis. No intrahepatic or extrahepatic bile duct dilation. No calcified gallstones. Normal gallbladder wall thickness. No pericholecystic inflammatory changes. Pancreas: Unremarkable. No pancreatic ductal dilatation or surrounding inflammatory changes. Spleen: Normal in size. Redemonstration of a 2.4 x 2.8 cm cystic lesion along the posteromedial aspect with several foci of calcifications along the inferior aspect. This is essentially unchanged since the prior study and favored to represent a mildly complex cyst. No other new focal lesion seen. Adrenals/Urinary Tract: Adrenal glands are unremarkable. No suspicious renal mass. No hydronephrosis. No renal or ureteric calculi. Unremarkable urinary bladder. Stomach/Bowel: There is short segment of colon (approximately 4 cm), at the level of junction of descending colon and proximal sigmoid colon, exhibiting mild irregular wall thickening and pericolonic fat stranding on the background of several diverticula, compatible with acute uncomplicated diverticulitis. No walled off abscess or loculated collection. No pneumoperitoneum. No disproportionate dilation of small or large bowel loops to suggest bowel obstruction. The appendix is unremarkable. Vascular/Lymphatic: No ascites or pneumoperitoneum. No abdominal or pelvic lymphadenopathy, by size criteria. No aneurysmal dilation of the major  abdominal arteries. Reproductive: Enlarged prostate. Symmetric seminal vesicles. Other: There are bilateral small fat containing inguinal hernias. The soft tissues and abdominal wall are otherwise unremarkable. Musculoskeletal: No suspicious osseous lesions. IMPRESSION: 1. Acute uncomplicated diverticulitis at the level of the junction of descending colon and proximal sigmoid colon. No walled off abscess or loculated collection. No pneumoperitoneum. 2. Multiple other nonacute observations, as described above. Electronically Signed   By: Jules Schick M.D.   On: 11/30/2022 09:06    Procedures Procedures    Medications Ordered in ED Medications  sodium chloride 0.9 % bolus 1,000 mL (1,000 mLs Intravenous New Bag/Given 11/30/22 0837)  morphine (PF) 4 MG/ML injection 4 mg (4 mg Intravenous Given 11/30/22 0837)  iohexol (OMNIPAQUE) 300 MG/ML solution 100 mL (100 mLs Intravenous Contrast Given 11/30/22 0843)    ED Course/ Medical Decision Making/ A&P                                 Medical Decision Making Amount and/or Complexity of Data Reviewed Labs: ordered. Radiology: ordered.  Risk Prescription drug management.   52 year old  male presents emergency department with left lower quadrant abdominal pain, similar to previous diverticulitis episodes.  He is afebrile, well-appearing on exam.  He has tenderness to palpation in the left lower quadrant with voluntary guarding.  Normal bowel sounds.  Blood work is reassuring, with mild elevation of Cr.  CT scan shows uncomplicated diverticulitis of the descending colon.  Patient has done well with antibiotic therapy in the past. 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   Will plan for oral antibiotics and outpatient follow-up.  Patient at this time appears safe and stable for discharge and close outpatient follow up. Discharge plan and strict return to ED precautions discussed,  patient verbalizes understanding and agreement.        Final Clinical Impression(s) / ED Diagnoses Final diagnoses:  None    Rx / DC Orders ED Discharge Orders     None         Rozelle Logan, DO 11/30/22 0930

## 2022-11-30 NOTE — Discharge Instructions (Signed)
You have been seen and discharged from the emergency department.  You were noticed with uncomplicated diverticulitis.  Take antibiotic as directed.  Take nausea and pain medicine as needed.  Do not mix this medication with alcohol or other sedating medications. Do not drive or do heavy physical activity until you know how this medication affects you.  It may cause drowsiness.  Follow-up with your primary provider for further evaluation and further care. Take home medications as prescribed. If you have any worsening symptoms or further concerns for your health please return to an emergency department for further evaluation.

## 2022-11-30 NOTE — ED Notes (Signed)
Patient transported to CT 

## 2022-11-30 NOTE — ED Triage Notes (Signed)
Pt reports to ED with complaints of abdominal pain. States that pain started yesterday. Hx of diverticulitis.

## 2023-06-07 ENCOUNTER — Other Ambulatory Visit: Payer: Self-pay

## 2023-06-07 ENCOUNTER — Emergency Department (HOSPITAL_BASED_OUTPATIENT_CLINIC_OR_DEPARTMENT_OTHER)
Admission: EM | Admit: 2023-06-07 | Discharge: 2023-06-07 | Disposition: A | Discharge status: Home / Self Care | Payer: 59 | Attending: Emergency Medicine | Admitting: Emergency Medicine

## 2023-06-07 ENCOUNTER — Emergency Department (HOSPITAL_BASED_OUTPATIENT_CLINIC_OR_DEPARTMENT_OTHER): Payer: 59

## 2023-06-07 ENCOUNTER — Encounter (HOSPITAL_BASED_OUTPATIENT_CLINIC_OR_DEPARTMENT_OTHER): Payer: Self-pay | Admitting: Emergency Medicine

## 2023-06-07 DIAGNOSIS — R944 Abnormal results of kidney function studies: Secondary | ICD-10-CM | POA: Diagnosis not present

## 2023-06-07 DIAGNOSIS — R1032 Left lower quadrant pain: Secondary | ICD-10-CM | POA: Diagnosis present

## 2023-06-07 DIAGNOSIS — K388 Other specified diseases of appendix: Secondary | ICD-10-CM | POA: Diagnosis not present

## 2023-06-07 DIAGNOSIS — K6389 Other specified diseases of intestine: Secondary | ICD-10-CM

## 2023-06-07 LAB — BASIC METABOLIC PANEL
Anion gap: 10 (ref 5–15)
BUN: 24 mg/dL — ABNORMAL HIGH (ref 6–20)
CO2: 21 mmol/L — ABNORMAL LOW (ref 22–32)
Calcium: 8.9 mg/dL (ref 8.9–10.3)
Chloride: 103 mmol/L (ref 98–111)
Creatinine, Ser: 1.25 mg/dL — ABNORMAL HIGH (ref 0.61–1.24)
GFR, Estimated: >60 mL/min (ref >60)
Glucose, Bld: 153 mg/dL — ABNORMAL HIGH (ref 70–99)
Potassium: 3.9 mmol/L (ref 3.5–5.1)
Sodium: 134 mmol/L — ABNORMAL LOW (ref 135–145)

## 2023-06-07 LAB — CBC WITH DIFFERENTIAL/PLATELET
Abs Immature Granulocytes: 0.02 10*3/uL (ref 0.00–0.07)
Basophils Absolute: 0 10*3/uL (ref 0.0–0.1)
Basophils Relative: 0 %
Eosinophils Absolute: 0.1 10*3/uL (ref 0.0–0.5)
Eosinophils Relative: 1 %
HCT: 48.7 % (ref 39.0–52.0)
Hemoglobin: 17 g/dL (ref 13.0–17.0)
Immature Granulocytes: 0 %
Lymphocytes Relative: 28 %
Lymphs Abs: 2.2 10*3/uL (ref 0.7–4.0)
MCH: 31.6 pg (ref 26.0–34.0)
MCHC: 34.9 g/dL (ref 30.0–36.0)
MCV: 90.5 fL (ref 80.0–100.0)
Monocytes Absolute: 0.7 10*3/uL (ref 0.1–1.0)
Monocytes Relative: 9 %
Neutro Abs: 4.9 10*3/uL (ref 1.7–7.7)
Neutrophils Relative %: 62 %
Platelets: 234 10*3/uL (ref 150–400)
RBC: 5.38 MIL/uL (ref 4.22–5.81)
RDW: 12.9 % (ref 11.5–15.5)
WBC: 8 10*3/uL (ref 4.0–10.5)
nRBC: 0 % (ref 0.0–0.2)

## 2023-06-07 LAB — URINALYSIS, ROUTINE W REFLEX MICROSCOPIC
Bilirubin Urine: NEGATIVE
Glucose, UA: NEGATIVE mg/dL
Hgb urine dipstick: NEGATIVE
Ketones, ur: NEGATIVE mg/dL
Leukocytes,Ua: NEGATIVE
Nitrite: NEGATIVE
Protein, ur: NEGATIVE mg/dL
Specific Gravity, Urine: 1.015 (ref 1.005–1.030)
pH: 7.5 (ref 5.0–8.0)

## 2023-06-07 MED ORDER — NAPROXEN 500 MG PO TABS: 500.0000 mg | ORAL_TABLET | Freq: Two times a day (BID) | ORAL | 0 refills | Status: AC

## 2023-06-07 MED ORDER — IOHEXOL 300 MG/ML  SOLN
100.0000 mL | Freq: Once | INTRAMUSCULAR | Status: AC | PRN
  Administered 2023-06-07: 100 mL via INTRAVENOUS

## 2023-06-07 MED ORDER — ONDANSETRON HCL 4 MG/2ML IJ SOLN
4.0000 mg | Freq: Once | INTRAMUSCULAR | Status: AC
  Administered 2023-06-07: 4 mg via INTRAVENOUS
  Filled 2023-06-07: qty 2

## 2023-06-07 MED ORDER — KETOROLAC TROMETHAMINE 30 MG/ML IJ SOLN
30.0000 mg | Freq: Once | INTRAMUSCULAR | Status: AC
  Administered 2023-06-07: 30 mg via INTRAVENOUS
  Filled 2023-06-07: qty 1

## 2023-06-07 NOTE — ED Provider Notes (Signed)
 [place] [facility] Provider Note   CSN: [id number] Arrival date & time: 06/07/23  9685     History  Chief Complaint  Patient presents with   Abdominal Pain    Alan Harris is a 53 y.o. male.  The history is provided by the patient.  Abdominal Pain Pain location:  LLQ Pain radiates to:  Does not radiate Pain severity:  Severe Onset quality:  Sudden Duration:  2 days Timing:  Constant Progression:  Unchanged Chronicity:  Recurrent Relieved by:  Nothing Worsened by:  Nothing Associated symptoms: no fever and no vomiting   Risk factors: has not had multiple surgeries   Patient with a history of diverticulitis x 7 in the past couple of years presents with LLQ pain that he feels is a flare of his diverticulitis.      Home Medications Prior to Admission medications   Medication Sig Start Date End Date Taking? Authorizing Provider  naproxen  (NAPROSYN ) 500 MG tablet Take 1 tablet (500 mg total) by mouth 2 (two) times daily with a meal. 06/07/23  Yes Jacolby Risby, MD  amoxicillin -clavulanate (AUGMENTIN ) 875-125 MG tablet Take 1 tablet by mouth every 12 (twelve) hours. 11/30/22   Horton, Roxie HERO, DO  doxycycline  (VIBRAMYCIN ) 100 MG capsule Take 1 capsule (100 mg total) by mouth 2 (two) times daily. One po bid x 7 days 04/19/21   Geroldine Berg, MD  ezetimibe-simvastatin (VYTORIN) 10-40 MG tablet Take 1 tablet by mouth daily. 01/26/19   [provider]  ibuprofen  (ADVIL ) 600 MG tablet Take 1 tablet (600 mg total) by mouth every 6 (six) hours as needed. 06/19/20   Nivia Colon, PA-C  naproxen  (NAPROSYN ) 500 MG tablet Take 1 tablet (500 mg total) by mouth 2 (two) times daily with a meal. 04/19/21   Geroldine Berg, MD  ondansetron  (ZOFRAN ) 4 MG tablet Take 1 tablet (4 mg total) by mouth every 6 (six) hours. 11/30/22   Horton, Roxie HERO, DO  traMADol  (ULTRAM ) 50 MG tablet Take 1 tablet (50 mg total) by mouth every 6 (six) hours as needed.  11/30/22   Horton, Kristie M, DO      Allergies    Patient has no known allergies.    Review of Systems   Review of Systems  Constitutional:  Negative for fever.  HENT:  Negative for facial swelling.   Eyes:  Negative for redness.  Gastrointestinal:  Positive for abdominal pain. Negative for vomiting.  All other systems reviewed and are negative.   Physical Exam Updated Vital Signs BP (!) 190/80 (BP Location: Right Arm)   Pulse 94   Temp 98.4 F (36.9 C) (Oral)   Resp 16   Ht 5' 9 (1.753 m)   Wt 93 kg   SpO2 100%   BMI 30.27 kg/m  Physical Exam Vitals and nursing note reviewed. Exam conducted with a chaperone present.  Constitutional:      General: He is not in acute distress.    Appearance: He is well-developed. He is not diaphoretic.  HENT:     Head: Normocephalic and atraumatic.     Nose: Nose normal.  Eyes:     Conjunctiva/sclera: Conjunctivae normal.     Pupils: Pupils are equal, round, and reactive to light.  Cardiovascular:     Rate and Rhythm: Normal rate and regular rhythm.     Pulses: Normal pulses.     Heart sounds: Normal heart sounds.  Pulmonary:     Effort: Pulmonary effort is  normal.     Breath sounds: Normal breath sounds. No wheezing or rales.  Abdominal:     General: Bowel sounds are normal.     Palpations: Abdomen is soft.     Tenderness: There is no abdominal tenderness. There is no guarding or rebound.  Musculoskeletal:        General: Normal range of motion.     Cervical back: Normal range of motion and neck supple.  Skin:    General: Skin is warm and dry.     Capillary Refill: Capillary refill takes less than 2 seconds.  Neurological:     General: No focal deficit present.     Mental Status: He is alert and oriented to person, place, and time.     Deep Tendon Reflexes: Reflexes normal.  Psychiatric:        Mood and Affect: Mood normal.        Behavior: Behavior normal.     ED Results / Procedures / Treatments   Labs (all labs  ordered are listed, but only abnormal results are displayed) Results for orders placed or performed during the hospital encounter of 06/07/23  CBC with Differential   Collection Time: 06/07/23  3:41 AM  Result Value Ref Range   WBC 8.0 4.0 - 10.5 K/uL   RBC 5.38 4.22 - 5.81 MIL/uL   Hemoglobin 17.0 13.0 - 17.0 g/dL   HCT 51.2 60.9 - 47.9 %   MCV 90.5 80.0 - 100.0 fL   MCH 31.6 26.0 - 34.0 pg   MCHC 34.9 30.0 - 36.0 g/dL   RDW 87.0 88.4 - 84.4 %   Platelets 234 150 - 400 K/uL   nRBC 0.0 0.0 - 0.2 %   Neutrophils Relative % 62 %   Neutro Abs 4.9 1.7 - 7.7 K/uL   Lymphocytes Relative 28 %   Lymphs Abs 2.2 0.7 - 4.0 K/uL   Monocytes Relative 9 %   Monocytes Absolute 0.7 0.1 - 1.0 K/uL   Eosinophils Relative 1 %   Eosinophils Absolute 0.1 0.0 - 0.5 K/uL   Basophils Relative 0 %   Basophils Absolute 0.0 0.0 - 0.1 K/uL   Immature Granulocytes 0 %   Abs Immature Granulocytes 0.02 0.00 - 0.07 K/uL  Basic metabolic panel   Collection Time: 06/07/23  3:41 AM  Result Value Ref Range   Sodium 134 (L) 135 - 145 mmol/L   Potassium 3.9 3.5 - 5.1 mmol/L   Chloride 103 98 - 111 mmol/L   CO2 21 (L) 22 - 32 mmol/L   Glucose, Bld 153 (H) 70 - 99 mg/dL   BUN 24 (H) 6 - 20 mg/dL   Creatinine, Ser 8.74 (H) 0.61 - 1.24 mg/dL   Calcium 8.9 8.9 - 89.6 mg/dL   GFR, Estimated >39 >39 mL/min   Anion gap 10 5 - 15  Urinalysis, Routine w reflex microscopic -Urine, Clean Catch   Collection Time: 06/07/23  5:06 AM  Result Value Ref Range   Color, Urine YELLOW YELLOW   APPearance CLEAR CLEAR   Specific Gravity, Urine 1.015 1.005 - 1.030   pH 7.5 5.0 - 8.0   Glucose, UA NEGATIVE NEGATIVE mg/dL   Hgb urine dipstick NEGATIVE NEGATIVE   Bilirubin Urine NEGATIVE NEGATIVE   Ketones, ur NEGATIVE NEGATIVE mg/dL   Protein, ur NEGATIVE NEGATIVE mg/dL   Nitrite NEGATIVE NEGATIVE   Leukocytes,Ua NEGATIVE NEGATIVE   CT ABDOMEN PELVIS W CONTRAST Result Date: 06/07/2023 CLINICAL DATA:  2 day history of pain.  History of diverticulosis and diverticulitis. EXAM: CT ABDOMEN AND PELVIS WITH CONTRAST TECHNIQUE: Multidetector CT imaging of the abdomen and pelvis was performed using the standard protocol following bolus administration of intravenous contrast. RADIATION DOSE REDUCTION: This exam was performed according to the departmental dose-optimization program which includes automated exposure control, adjustment of the mA and/or kV according to patient size and/or use of iterative reconstruction technique. CONTRAST:  OMNIPAQUE  IOHEXOL  300 MG/ML  SOLN COMPARISON:  11/30/2022 FINDINGS: Lower chest: Unremarkable. Hepatobiliary: The liver shows diffusely decreased attenuation suggesting fat deposition. There is no evidence for gallstones, gallbladder wall thickening, or pericholecystic fluid. No intrahepatic or extrahepatic biliary dilation. Pancreas: No focal mass lesion. No dilatation of the main duct. No intraparenchymal cyst. No peripancreatic edema. Spleen: Stable 2.7 cm cystic lesion in the dome of the spleen with associated dystrophic calcification, likely post infectious or traumatic cyst or pseudocyst. Adrenals/Urinary Tract: No adrenal nodule or mass. Right kidney unremarkable. Tiny well-defined homogeneous low-density lesion in the left kidney is too small to characterize but is statistically most likely benign and probably a cyst. No followup imaging is recommended. No evidence for hydroureter. The urinary bladder appears normal for the degree of distention. Stomach/Bowel: Stomach is unremarkable. No gastric wall thickening. No evidence of outlet obstruction. Duodenum is normally positioned as is the ligament of Treitz. No small bowel wall thickening. No small bowel dilatation. The terminal ileum is normal. The appendix is normal. No gross colonic mass. Diverticular changes are noted in the left colon. There is a subtle focus of inflammatory change along the mid sigmoid colon just deep to the anterior  abdominal wall (axial 68/301. On sagittal imaging (79/602), these changes have an elongated target or bull's-eye appearance, characteristic for epiploic appendagitis. Vascular/Lymphatic: No abdominal aortic aneurysm. No abdominal aortic atherosclerotic calcification. There is no gastrohepatic or hepatoduodenal ligament lymphadenopathy. No retroperitoneal or mesenteric lymphadenopathy. No pelvic sidewall lymphadenopathy. Reproductive: Prostate gland is enlarged. Other: No intraperitoneal free fluid. Musculoskeletal: No worrisome lytic or sclerotic osseous abnormality. IMPRESSION: 1. Subtle focus of inflammatory change along the mid sigmoid colon just deep to the anterior abdominal wall. Imaging characteristics are most consistent with epiploic appendagitis. 2. Left colonic diverticulosis without definite diverticulitis. 3. Hepatic steatosis. 4. Prostatomegaly. Electronically Signed   By: Camellia Candle M.D.   On: 06/07/2023 06:01    Radiology CT ABDOMEN PELVIS W CONTRAST Result Date: 06/07/2023 CLINICAL DATA:  2 day history of pain. History of diverticulosis and diverticulitis. EXAM: CT ABDOMEN AND PELVIS WITH CONTRAST TECHNIQUE: Multidetector CT imaging of the abdomen and pelvis was performed using the standard protocol following bolus administration of intravenous contrast. RADIATION DOSE REDUCTION: This exam was performed according to the departmental dose-optimization program which includes automated exposure control, adjustment of the mA and/or kV according to patient size and/or use of iterative reconstruction technique. CONTRAST:  OMNIPAQUE  IOHEXOL  300 MG/ML  SOLN COMPARISON:  11/30/2022 FINDINGS: Lower chest: Unremarkable. Hepatobiliary: The liver shows diffusely decreased attenuation suggesting fat deposition. There is no evidence for gallstones, gallbladder wall thickening, or pericholecystic fluid. No intrahepatic or extrahepatic biliary dilation. Pancreas: No focal mass lesion. No dilatation  of the main duct. No intraparenchymal cyst. No peripancreatic edema. Spleen: Stable 2.7 cm cystic lesion in the dome of the spleen with associated dystrophic calcification, likely post infectious or traumatic cyst or pseudocyst. Adrenals/Urinary Tract: No adrenal nodule or mass. Right kidney unremarkable. Tiny well-defined homogeneous low-density lesion in the left kidney is too small to characterize but is statistically most likely benign and  probably a cyst. No followup imaging is recommended. No evidence for hydroureter. The urinary bladder appears normal for the degree of distention. Stomach/Bowel: Stomach is unremarkable. No gastric wall thickening. No evidence of outlet obstruction. Duodenum is normally positioned as is the ligament of Treitz. No small bowel wall thickening. No small bowel dilatation. The terminal ileum is normal. The appendix is normal. No gross colonic mass. Diverticular changes are noted in the left colon. There is a subtle focus of inflammatory change along the mid sigmoid colon just deep to the anterior abdominal wall (axial 68/301. On sagittal imaging (79/602), these changes have an elongated target or bull's-eye appearance, characteristic for epiploic appendagitis. Vascular/Lymphatic: No abdominal aortic aneurysm. No abdominal aortic atherosclerotic calcification. There is no gastrohepatic or hepatoduodenal ligament lymphadenopathy. No retroperitoneal or mesenteric lymphadenopathy. No pelvic sidewall lymphadenopathy. Reproductive: Prostate gland is enlarged. Other: No intraperitoneal free fluid. Musculoskeletal: No worrisome lytic or sclerotic osseous abnormality. IMPRESSION: 1. Subtle focus of inflammatory change along the mid sigmoid colon just deep to the anterior abdominal wall. Imaging characteristics are most consistent with epiploic appendagitis. 2. Left colonic diverticulosis without definite diverticulitis. 3. Hepatic steatosis. 4. Prostatomegaly. Electronically Signed    By: Camellia Candle M.D.   On: 06/07/2023 06:01    Procedures Procedures    Medications Ordered in ED Medications  ketorolac  (TORADOL ) 30 MG/ML injection 30 mg (30 mg Intravenous Given 06/07/23 0338)  ondansetron  (ZOFRAN ) injection 4 mg (4 mg Intravenous Given 06/07/23 0338)  iohexol  (OMNIPAQUE ) 300 MG/ML solution 100 mL (100 mLs Intravenous Contrast Given 06/07/23 0421)    ED Course/ Medical Decision Making/ A&P                                 Medical Decision Making Patient with LLQ pain x 2 days   Amount and/or Complexity of Data Reviewed External Data Reviewed: notes.    Details: Previous notes reviewed  Labs: ordered.    Details: Normal white count 8, normal hemoglobin 17, normal platelets.  Sodium 134, normal potassium 3.9, creatinine slight elevation 1.25, normal urine  Radiology: ordered and independent interpretation performed.    Details: Appendix normal By me   Risk Prescription drug management. Risk Details: Patient has epiploic appendagitis and this is treated with NSAID. I will refer to surgery in case of worsening symptoms.  Very well appearing.  Stable for discharge.  Strict returns.     Final Clinical Impression(s) / ED Diagnoses Final diagnoses:  Epiploic appendagitis    I have reviewed the triage vital signs and the nursing notes. Pertinent labs & imaging results that were available during my care of the patient were reviewed by me and considered in my medical decision making (see chart for details). After history, exam, and medical workup I feel the patient has been appropriately medically screened and is safe for discharge home. Pertinent diagnoses were discussed with the patient. Patient was given return precautions.    Rx / DC Orders ED Discharge Orders          Ordered    naproxen  (NAPROSYN ) 500 MG tablet  2 times daily with meals        06/07/23 0605              Janeli Lewison, MD 06/07/23 9367

## 2023-06-07 NOTE — Discharge Instructions (Signed)
 Epiploic appendagitis is a  cause of sudden abdominal pain. The name means "inflammation of an epiploic appendage." Epiploic appendages are small pieces of fat that attach to and hang from the outside of your large intestine (colon). If one of them gets twisted, it can cause pain.  Treatment is NSAIDs.

## 2023-06-07 NOTE — ED Triage Notes (Signed)
 Pt states has Hx of diverticulosis with diverticulitis. Feels like has a flare up. Pain X 2 days.

## 2024-02-19 ENCOUNTER — Other Ambulatory Visit: Payer: Self-pay

## 2024-02-19 ENCOUNTER — Emergency Department (HOSPITAL_BASED_OUTPATIENT_CLINIC_OR_DEPARTMENT_OTHER)

## 2024-02-19 ENCOUNTER — Emergency Department (HOSPITAL_BASED_OUTPATIENT_CLINIC_OR_DEPARTMENT_OTHER)
Admission: EM | Admit: 2024-02-19 | Discharge: 2024-02-19 | Disposition: A | Discharge status: Home / Self Care | Attending: Emergency Medicine | Admitting: Emergency Medicine

## 2024-02-19 ENCOUNTER — Encounter (HOSPITAL_BASED_OUTPATIENT_CLINIC_OR_DEPARTMENT_OTHER): Payer: Self-pay

## 2024-02-19 DIAGNOSIS — K5732 Diverticulitis of large intestine without perforation or abscess without bleeding: Secondary | ICD-10-CM | POA: Diagnosis not present

## 2024-02-19 DIAGNOSIS — R1032 Left lower quadrant pain: Secondary | ICD-10-CM | POA: Diagnosis present

## 2024-02-19 DIAGNOSIS — K5792 Diverticulitis of intestine, part unspecified, without perforation or abscess without bleeding: Secondary | ICD-10-CM

## 2024-02-19 LAB — CBC
HCT: 53.1 % — ABNORMAL HIGH (ref 39.0–52.0)
Hemoglobin: 18.3 g/dL — ABNORMAL HIGH (ref 13.0–17.0)
MCH: 31.2 pg (ref 26.0–34.0)
MCHC: 34.5 g/dL (ref 30.0–36.0)
MCV: 90.6 fL (ref 80.0–100.0)
Platelets: 221 K/uL (ref 150–400)
RBC: 5.86 MIL/uL — ABNORMAL HIGH (ref 4.22–5.81)
RDW: 12.9 % (ref 11.5–15.5)
WBC: 10.2 K/uL (ref 4.0–10.5)
nRBC: 0 % (ref 0.0–0.2)

## 2024-02-19 LAB — URINALYSIS, MICROSCOPIC (REFLEX)

## 2024-02-19 LAB — COMPREHENSIVE METABOLIC PANEL WITH GFR
ALT: 33 U/L (ref 0–44)
AST: 27 U/L (ref 15–41)
Albumin: 4.9 g/dL (ref 3.5–5.0)
Alkaline Phosphatase: 86 U/L (ref 38–126)
Anion gap: 13 (ref 5–15)
BUN: 13 mg/dL (ref 6–20)
CO2: 23 mmol/L (ref 22–32)
Calcium: 9.8 mg/dL (ref 8.9–10.3)
Chloride: 100 mmol/L (ref 98–111)
Creatinine, Ser: 1.23 mg/dL (ref 0.61–1.24)
GFR, Estimated: >60 mL/min (ref >60)
Glucose, Bld: 155 mg/dL — ABNORMAL HIGH (ref 70–99)
Potassium: 4.4 mmol/L (ref 3.5–5.1)
Sodium: 136 mmol/L (ref 135–145)
Total Bilirubin: 1 mg/dL (ref 0.0–1.2)
Total Protein: 8 g/dL (ref 6.5–8.1)

## 2024-02-19 LAB — URINALYSIS, ROUTINE W REFLEX MICROSCOPIC
Glucose, UA: NEGATIVE mg/dL
Hgb urine dipstick: NEGATIVE
Ketones, ur: NEGATIVE mg/dL
Leukocytes,Ua: NEGATIVE
Nitrite: NEGATIVE
Protein, ur: 100 mg/dL — AB
Specific Gravity, Urine: >=1.03 (ref 1.005–1.030)
pH: 6 (ref 5.0–8.0)

## 2024-02-19 LAB — LIPASE, BLOOD: Lipase: 23 U/L (ref 11–51)

## 2024-02-19 MED ORDER — ONDANSETRON HCL 4 MG/2ML IJ SOLN
4.0000 mg | Freq: Once | INTRAMUSCULAR | Status: AC
  Administered 2024-02-19: 4 mg via INTRAVENOUS
  Filled 2024-02-19: qty 2

## 2024-02-19 MED ORDER — IOHEXOL 300 MG/ML  SOLN
100.0000 mL | Freq: Once | INTRAMUSCULAR | Status: AC | PRN
  Administered 2024-02-19: 100 mL via INTRAVENOUS

## 2024-02-19 MED ORDER — HYDROMORPHONE HCL 1 MG/ML IJ SOLN
0.5000 mg | Freq: Once | INTRAMUSCULAR | Status: AC
  Administered 2024-02-19: 0.5 mg via INTRAVENOUS
  Filled 2024-02-19: qty 1

## 2024-02-19 MED ORDER — OXYCODONE HCL 5 MG PO TABS: 5.0000 mg | ORAL_TABLET | ORAL | 0 refills | Status: AC | PRN

## 2024-02-19 NOTE — ED Provider Notes (Signed)
 [facility] Provider Note   CSN: [id number] Arrival date & time: 02/19/24  0710     Patient presents with: Abdominal Pain   Alan Harris is a 53 y.o. male.   53 year old male with a history of diverticulitis and kidney stones who presents emergency department for left lower quadrant abdominal pain.  Has been having it for over a week.  Intermittent.  Dull severe pain.  Radiates to his back.  Says that it is currently a 5/10 in severity because he took some tramadol  before coming in.  No nausea or vomiting.  No fevers.  No diarrhea.  No dysuria or frequency or hematuria.  Went to urgent care yesterday and was started on Augmentin .  Had a urinalysis did not show any blood.  Came into the emergency department today because of worsening pain       Prior to Admission medications   Medication Sig Start Date End Date Taking? Authorizing Provider  oxyCODONE (ROXICODONE) 5 MG immediate release tablet Take 1 tablet (5 mg total) by mouth every 4 (four) hours as needed for severe pain (pain score 7-10). 02/19/24  Yes Yolande Lamar BROCKS, MD  amoxicillin -clavulanate (AUGMENTIN ) 875-125 MG tablet Take 1 tablet by mouth every 12 (twelve) hours. 11/30/22   Horton, Roxie HERO, DO  doxycycline  (VIBRAMYCIN ) 100 MG capsule Take 1 capsule (100 mg total) by mouth 2 (two) times daily. One po bid x 7 days 04/19/21   Geroldine Berg, MD  ezetimibe-simvastatin (VYTORIN) 10-40 MG tablet Take 1 tablet by mouth daily. 01/26/19   [provider]  ibuprofen  (ADVIL ) 600 MG tablet Take 1 tablet (600 mg total) by mouth every 6 (six) hours as needed. 06/19/20   Nivia Colon, PA-C  naproxen  (NAPROSYN ) 500 MG tablet Take 1 tablet (500 mg total) by mouth 2 (two) times daily with a meal. 04/19/21   Geroldine Berg, MD  naproxen  (NAPROSYN ) 500 MG tablet Take 1 tablet (500 mg total) by mouth 2 (two) times daily with a meal. 06/07/23   Palumbo, April, MD  ondansetron  (ZOFRAN ) 4 MG  tablet Take 1 tablet (4 mg total) by mouth every 6 (six) hours. 11/30/22   Horton, Roxie HERO, DO  traMADol  (ULTRAM ) 50 MG tablet Take 1 tablet (50 mg total) by mouth every 6 (six) hours as needed. 11/30/22   Horton, Kristie M, DO    Allergies: Patient has no known allergies.    Review of Systems  Updated Vital Signs BP (!) 150/108   Pulse 89   Temp 98.4 F (36.9 C) (Oral)   Resp 18   SpO2 97%   Physical Exam Vitals and nursing note reviewed.  Constitutional:      General: He is not in acute distress.    Appearance: He is well-developed.  HENT:     Head: Normocephalic and atraumatic.     Right Ear: External ear normal.     Left Ear: External ear normal.     Nose: Nose normal.  Eyes:     Extraocular Movements: Extraocular movements intact.     Conjunctiva/sclera: Conjunctivae normal.     Pupils: Pupils are equal, round, and reactive to light.  Abdominal:     General: There is no distension.     Palpations: Abdomen is soft. There is no mass.     Tenderness: There is abdominal tenderness (Left lower quadrant, suprapubic). There is no right CVA tenderness, left CVA tenderness or guarding.  Musculoskeletal:     Cervical back:  Normal range of motion and neck supple.  Neurological:     Mental Status: He is alert.     (all labs ordered are listed, but only abnormal results are displayed) Labs Reviewed  COMPREHENSIVE METABOLIC PANEL WITH GFR - Abnormal; Notable for the following components:      Result Value   Glucose, Bld 155 (*)    All other components within normal limits  CBC - Abnormal; Notable for the following components:   RBC 5.86 (*)    Hemoglobin 18.3 (*)    HCT 53.1 (*)    All other components within normal limits  URINALYSIS, ROUTINE W REFLEX MICROSCOPIC - Abnormal; Notable for the following components:   Bilirubin Urine SMALL (*)    Protein, ur 100 (*)    All other components within normal limits  URINALYSIS, MICROSCOPIC (REFLEX) - Abnormal; Notable for the  following components:   Bacteria, UA RARE (*)    All other components within normal limits  LIPASE, BLOOD    EKG: None  Radiology: CT ABDOMEN PELVIS W CONTRAST Result Date: 02/19/2024 EXAM: CT ABDOMEN AND PELVIS WITH CONTRAST 02/19/2024 08:44:36 AM TECHNIQUE: CT of the abdomen and pelvis was performed with the administration of 100 mL of iohexol  (OMNIPAQUE ) 300 MG/ML solution. Multiplanar reformatted images are provided for review. Automated exposure control, iterative reconstruction, and/or weight-based adjustment of the mA/kV was utilized to reduce the radiation dose to as low as reasonably achievable. COMPARISON: CT abdomen and pelvis 06/07/2023. CLINICAL HISTORY: 53 year old male. LLQ pain, nausea for 1 week, diarrhea, history of diverticulitis, pain worsened today. FINDINGS: LOWER CHEST: Lung bases are stable with minor atelectasis or scarring. LIVER: Evidence of chronic hepatic steatosis. GALLBLADDER AND BILE DUCTS: Gallbladder is unremarkable. No biliary ductal dilatation. 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 PANCREAS: No acute abnormality. ADRENAL GLANDS: No acute abnormality. KIDNEYS, URETERS AND BLADDER: Normal bilateral renal enhancement and contrast excretion to diminutive ureters. No stones in the kidneys or ureters. No hydronephrosis. No perinephric or periureteral stranding. Diminutive bladder with mildly irregular wall, mild bladder inflammation difficult to exclude on coronal image 58. GI AND BOWEL: Stomach demonstrates no acute abnormality. There is no bowel obstruction. Negative rectum. Diverticulosis of the sigmoid colon and the distal descending colon with no active inflammation. However, inflamed mid descending colon segment measuring about 5 cm (parenchymal image 67) with inflamed diverticula and mesenteric edema there. Decompressed upstream large bowel with additional diverticula in the transverse colon. Nondilated small  bowel. APPENDIX: Normal appendix on series 2 image 58. PERITONEUM AND RETROPERITONEUM: No ascites. No free air. No pneumoperitoneum or free fluid identified. VASCULATURE: Aorta is normal in caliber. LYMPH NODES: No lymphadenopathy. REPRODUCTIVE ORGANS: Adjacent chronically hypertrophied, prominent seminal vesicles do not appear significantly changed. No other regional inflammation is evident. BONES AND SOFT TISSUES: No acute osseous abnormality. No focal soft tissue abnormality. IMPRESSION: 1. Acute uncomplicated diverticulitis involving a 5 cm segment of the mid descending colon with regional mesenteric edema. 2. Decompressed bladder with mildly irregular wall; UTI or cystitis is difficult to exclude. Electronically signed by: Helayne Hurst MD 02/19/2024 08:54 AM EDT RP Workstation: [id number]     Procedures   Medications Ordered in the ED  ondansetron  (ZOFRAN ) injection 4 mg (4 mg Intravenous Given 02/19/24 0753)  HYDROmorphone (DILAUDID) injection 0.5 mg (0.5 mg Intravenous Given 02/19/24 0753)  iohexol  (OMNIPAQUE ) 300 MG/ML solution 100 mL (100 mLs Intravenous Contrast Given 02/19/24 0834)  Medical Decision Making Amount and/or Complexity of Data Reviewed Labs: ordered. Radiology: ordered.  Risk Prescription drug management.   53 year old male with a history of diverticulitis and kidney stones who presents emergency department with left lower quadrant abdominal pain  Initial Ddx:  Diverticulitis, complicated diverticulitis, epiploic appendagitis, colitis, kidney stone, cystitis  MDM/Course:   9:21 AM Patient presents to the emergency department with left lower quadrant and suprapubic abdominal pain.  Has had multiple bouts of diverticulitis before.  Was started on Augmentin  as an outpatient but reports persistence and worsening pain.  Has been self-medicating with tramadol  at home.  On exam does have suprapubic and left lower quadrant abdominal  tenderness palpation.  No rebound or guarding.  Vital signs reassuring.  Performed shared decision making regarding repeat imaging at this point in time and they would like to have a CT scan.  Will give him some pain and nausea medication as well and send blood work.  Low concern for kidney stone at this point in time especially since he had a urinalysis yesterday that did not show any blood and he is not having severe flank pain.   9:21 AM Upon re-evaluation patient has remained stable.  No significant nausea and vomiting.  CT scan has showed acute uncomplicated diverticulitis.  Did discuss with the patient he has had it over 5 time so we will refer him to surgery for follow-up.  He has already had a colonoscopy that did not show malignancy that would be causing it.  Will have him continue his Augmentin  and I do not think that we can call this treatment failure since he is only had 2 doses and is not septic or having any other complications.  Instructed to be on a clear liquid diet.  Given oxycodone and told not to take it with the tramadol .  This patient presents to the ED for concern of complaints listed in HPI, this involves an extensive number of treatment options, and is a complaint that carries with it a high risk of complications and morbidity. Disposition including potential need for admission considered.   Dispo: DC Home. Return precautions discussed including, but not limited to, those listed in the AVS. Allowed pt time to ask questions which were answered fully prior to dc.  Additional history obtained from spouse Records reviewed Outpatient Clinic Notes The following labs were independently interpreted: Chemistry and show no acute abnormality I independently reviewed the following imaging with scope of interpretation limited to determining acute life threatening conditions related to emergency care: CT Abdomen/Pelvis and agree with the radiologist interpretation with the following  exceptions: none I personally reviewed and interpreted cardiac monitoring: normal sinus rhythm  I personally reviewed and interpreted the pt's EKG: see above for interpretation  I have reviewed the patients home medications and made adjustments as needed  Portions of this note were generated with Dragon dictation software. Dictation errors may occur despite best attempts at proofreading.     Final diagnoses:  Diverticulitis    ED Discharge Orders          Ordered    oxyCODONE (ROXICODONE) 5 MG immediate release tablet  Every 4 hours PRN        02/19/24 0915               Yolande Lamar BROCKS, MD 02/19/24 [phone]

## 2024-02-19 NOTE — Discharge Instructions (Addendum)
 You were seen for diverticulitis in the emergency department.   At home, please take the antibiotics you have been prescribed. Start with a clear liquid diet (coffee, tea, sports drinks, broths, etc.) and advance as tolerated to solid foods over the next few days.  Take Tylenol and ibuprofen  for pain. You may also take the oxycodone we have prescribed you for any breakthrough pain that may have.  Do not take this before driving or operating heavy machinery.  Do not take this medication with alcohol.  Check your MyChart online for the results of any tests that had not resulted by the time you left the emergency department.   Follow-up with your primary doctor in 2-3 days regarding your visit.  Follow-up with general surgery to see if you need a colectomy since you have had recurrent diverticulitis multiple times.  Return immediately to the emergency department if you experience any of the following: severe abdominal pain, high fevers, or any other concerning symptoms.    Thank you for visiting our Emergency Department. It was a pleasure taking care of you today.

## 2024-02-19 NOTE — ED Triage Notes (Signed)
 Reports LLQ abd pain, nausea for 1 week Denies emesis, urinary symptoms, fevers. States has diarrhea from augmentin  x1 day from UC  Hx of diverticulitis, given abx from UC for possible diverticulitis, states pain got worse today
